# Patient Record
Sex: Female | Born: 1985 | Race: Black or African American | Hispanic: No | Marital: Single | Smoking: Current every day smoker
Health system: Southern US, Community
[De-identification: ages and names within clinical notes are randomized; demographics above are authoritative.]

## PROBLEM LIST (undated history)

## (undated) DIAGNOSIS — IMO0002 Reserved for concepts with insufficient information to code with codable children: Secondary | ICD-10-CM

## (undated) DIAGNOSIS — L309 Dermatitis, unspecified: Secondary | ICD-10-CM

## (undated) DIAGNOSIS — M329 Systemic lupus erythematosus, unspecified: Secondary | ICD-10-CM

## (undated) HISTORY — PX: HERNIA REPAIR: SHX51

---

## 2008-01-01 ENCOUNTER — Emergency Department (HOSPITAL_BASED_OUTPATIENT_CLINIC_OR_DEPARTMENT_OTHER): Admission: EM | Admit: 2008-01-01 | Discharge: 2008-01-01 | Payer: Self-pay | Admitting: Emergency Medicine

## 2010-11-10 LAB — URINALYSIS, ROUTINE W REFLEX MICROSCOPIC
Glucose, UA: NEGATIVE
Ketones, ur: NEGATIVE
Nitrite: NEGATIVE
Specific Gravity, Urine: 1.022

## 2010-11-10 LAB — PREGNANCY, URINE: Preg Test, Ur: NEGATIVE

## 2010-11-10 LAB — BASIC METABOLIC PANEL
BUN: 13
CO2: 27
Calcium: 9.9
Chloride: 103
GFR calc Af Amer: >60
Potassium: 3.8

## 2010-11-10 LAB — GC/CHLAMYDIA PROBE AMP, GENITAL
Chlamydia, DNA Probe: NEGATIVE
GC Probe Amp, Genital: NEGATIVE

## 2010-11-10 LAB — URINE MICROSCOPIC-ADD ON

## 2010-11-10 LAB — DIFFERENTIAL
Eosinophils Absolute: 0
Lymphs Abs: 1.5
Neutro Abs: 6.3

## 2010-11-10 LAB — WET PREP, GENITAL: Yeast Wet Prep HPF POC: NONE SEEN

## 2010-11-10 LAB — CBC
HCT: 38.3
MCV: 93.5
WBC: 8.3

## 2016-07-04 ENCOUNTER — Emergency Department (HOSPITAL_COMMUNITY)
Admission: EM | Admit: 2016-07-04 | Discharge: 2016-07-04 | Disposition: A | Discharge status: Home / Self Care | Payer: Medicaid Other | Attending: Emergency Medicine | Admitting: Emergency Medicine

## 2016-07-04 ENCOUNTER — Encounter (HOSPITAL_COMMUNITY): Payer: Self-pay | Admitting: *Deleted

## 2016-07-04 DIAGNOSIS — M329 Systemic lupus erythematosus, unspecified: Secondary | ICD-10-CM | POA: Insufficient documentation

## 2016-07-04 DIAGNOSIS — M79605 Pain in left leg: Secondary | ICD-10-CM | POA: Diagnosis not present

## 2016-07-04 DIAGNOSIS — M79604 Pain in right leg: Secondary | ICD-10-CM | POA: Insufficient documentation

## 2016-07-04 DIAGNOSIS — F17200 Nicotine dependence, unspecified, uncomplicated: Secondary | ICD-10-CM | POA: Insufficient documentation

## 2016-07-04 HISTORY — DX: Systemic lupus erythematosus, unspecified: M32.9

## 2016-07-04 HISTORY — DX: Reserved for concepts with insufficient information to code with codable children: IMO0002

## 2016-07-04 MED ORDER — PREDNISONE 20 MG PO TABS: ORAL_TABLET | ORAL | 0 refills | Status: DC

## 2016-07-04 MED ORDER — PREDNISONE 20 MG PO TABS
60.0000 mg | ORAL_TABLET | Freq: Once | ORAL | Status: AC
  Administered 2016-07-04: 60 mg via ORAL
  Filled 2016-07-04: qty 3

## 2016-07-04 MED ORDER — HYDROCODONE-ACETAMINOPHEN 5-325 MG PO TABS
2.0000 | ORAL_TABLET | Freq: Once | ORAL | Status: AC
Start: 2016-07-04 — End: 2016-07-04
  Administered 2016-07-04: 2 via ORAL
  Filled 2016-07-04: qty 2

## 2016-07-04 MED ORDER — HYDROMORPHONE HCL 1 MG/ML IJ SOLN
1.0000 mg | Freq: Once | INTRAMUSCULAR | Status: AC
  Administered 2016-07-04: 1 mg via INTRAVENOUS
  Filled 2016-07-04: qty 1

## 2016-07-04 MED ORDER — HYDROCODONE-ACETAMINOPHEN 5-325 MG PO TABS: 1.0000 | ORAL_TABLET | Freq: Four times a day (QID) | ORAL | 0 refills | Status: DC | PRN

## 2016-07-04 NOTE — ED Triage Notes (Signed)
Pt reports having lupus, has been out of her meds x 3 months and has increase in leg pain and swelling/numbness to her legs. No acute distress is noted at triage.

## 2016-07-04 NOTE — Discharge Instructions (Signed)
Call any of the numbers on these discharge instructions. Get a primary care physician. Take Tylenol for mild pain or the pain medicine prescribed for bad pain. Don't take Tylenol together with the pain medicine prescribed as the combination can be dangerous to your liver

## 2016-07-04 NOTE — ED Provider Notes (Signed)
[facility]-EMERGENCY DEPT Provider Note   CSN: [id number] Arrival date & time: 07/04/16  1525     History   Chief Complaint Chief Complaint  Patient presents with  . Leg Pain  . Lupus    HPI Tammy Hale is a 31 y.o. female.Complains of bilateral leg pain and swelling for the past 3 months, progressively worsenins. Symptoms typical this. She reports that she's been out of her medication for the past 3 months which include Norco and prednisone. Pain is worse with walking not improved with anything no fever no other associated symptoms.. She reports she fell yesterday, but did not sustain any injury HPI  Past Medical History:  Diagnosis Date  . Lupus     There are no active problems to display for this patient.   History reviewed. No pertinent surgical history.  OB History    No data available       Home Medications    Prior to Admission medications   Medication Sig Start Date End Date Taking? Authorizing Provider  acetaminophen (TYLENOL) 500 MG tablet Take 1,000 mg by mouth every 6 (six) hours as needed for moderate pain.   Yes [provider]    Family History History reviewed. No pertinent family history.  Social History Social History  Substance Use Topics  . Smoking status: Current Every Day Smoker  . Smokeless tobacco: Not on file  . Alcohol use No     Allergies   Penicillins   Review of Systems Review of Systems  Constitutional: Negative.   HENT: Negative.   Respiratory: Negative.   Cardiovascular: Positive for leg swelling.  Gastrointestinal: Negative.   Musculoskeletal: Positive for myalgias.       Bilateral leg pain  Skin: Negative.   Neurological: Negative.   Psychiatric/Behavioral: Negative.   All other systems reviewed and are negative.    Physical Exam Updated Vital Signs BP 106/66 (BP Location: Right Arm)   Pulse 82   Temp 98 F (36.7 C) (Oral)   Resp 20   SpO2 100%   Physical Exam  Constitutional: She appears  well-developed and well-nourished.  Tearful  HENT:  Head: Normocephalic and atraumatic.  Eyes: Conjunctivae are normal. Pupils are equal, round, and reactive to light.  Neck: Neck supple. No tracheal deviation present. No thyromegaly present.  Cardiovascular: Normal rate and regular rhythm.   No murmur heard. Pulmonary/Chest: Effort normal and breath sounds normal.  Abdominal: Soft. Bowel sounds are normal. She exhibits no distension. There is no tenderness.  Musculoskeletal: Normal range of motion. She exhibits no edema or tenderness.  Bilateral lower extremities mildly tender and swollen along shins. DP pulses 2+. Bilaterally. Skin is not red or hot  Neurological: She is alert. Coordination normal.  Skin: Skin is warm and dry. No rash noted.  Psychiatric: She has a normal mood and affect.  Nursing note and vitals reviewed.    ED Treatments / Results  Labs (all labs ordered are listed, but only abnormal results are displayed) Labs Reviewed - No data to display  EKG  EKG Interpretation None       Radiology No results found.  Procedures Procedures (including critical care time)  Medications Ordered in ED Medications  HYDROcodone-acetaminophen (NORCO/VICODIN) 5-325 MG per tablet 2 tablet (2 tablets Oral Given 07/04/16 1810)  predniSONE (DELTASONE) tablet 60 mg (60 mg Oral Given 07/04/16 1810)     Initial Impression / Assessment and Plan / ED Course  I have reviewed the triage vital signs and the nursing notes.  Pertinent labs & imaging results that were available during my care of the patient were reviewed by me and considered in my medical decision making (see chart for details).     Patient feels improved after treatment with oral and intravenous opioids and oral prednisone. She is alert and able to ambulate without difficulty. Not lightheaded on standing. Plan prescriptions prednisone, Norco . Referral primary care [facility] [place] Controlled Substance reporting  System queried Final Clinical Impressions(s) / ED Diagnoses  Diagnosis #1 bilateral leg pain #2 Lupus flare up Final diagnoses:  None    New Prescriptions New Prescriptions   No medications on file     Doug Sou, MD 07/04/16 2051

## 2016-07-20 ENCOUNTER — Encounter (HOSPITAL_COMMUNITY): Payer: Self-pay | Admitting: *Deleted

## 2016-07-20 DIAGNOSIS — G8929 Other chronic pain: Secondary | ICD-10-CM | POA: Insufficient documentation

## 2016-07-20 DIAGNOSIS — M79661 Pain in right lower leg: Secondary | ICD-10-CM | POA: Insufficient documentation

## 2016-07-20 DIAGNOSIS — M79662 Pain in left lower leg: Secondary | ICD-10-CM | POA: Insufficient documentation

## 2016-07-20 DIAGNOSIS — F17200 Nicotine dependence, unspecified, uncomplicated: Secondary | ICD-10-CM | POA: Insufficient documentation

## 2016-07-20 NOTE — ED Triage Notes (Signed)
Pt c/o lupus flare up for a few days. Reports numbness in R leg and low back pain consistent with previous flareups

## 2016-07-21 ENCOUNTER — Emergency Department (HOSPITAL_COMMUNITY)
Admission: EM | Admit: 2016-07-21 | Discharge: 2016-07-21 | Disposition: A | Discharge status: Home / Self Care | Payer: Medicaid Other | Attending: Emergency Medicine | Admitting: Emergency Medicine

## 2016-07-21 DIAGNOSIS — G8929 Other chronic pain: Secondary | ICD-10-CM

## 2016-07-21 LAB — CBC WITH DIFFERENTIAL/PLATELET
BASOS ABS: 0 10*3/uL (ref 0.0–0.1)
BASOS PCT: 0 %
EOS PCT: 2 %
Eosinophils Absolute: 0.2 10*3/uL (ref 0.0–0.7)
HCT: 36.9 % (ref 36.0–46.0)
Hemoglobin: 12.3 g/dL (ref 12.0–15.0)
Lymphocytes Relative: 44 %
Lymphs Abs: 4.3 10*3/uL — ABNORMAL HIGH (ref 0.7–4.0)
MCH: 31.5 pg (ref 26.0–34.0)
MCHC: 33.3 g/dL (ref 30.0–36.0)
MCV: 94.6 fL (ref 78.0–100.0)
MONO ABS: 0.5 10*3/uL (ref 0.1–1.0)
Monocytes Relative: 5 %
Neutro Abs: 4.8 10*3/uL (ref 1.7–7.7)
Neutrophils Relative %: 49 %
PLATELETS: 215 10*3/uL (ref 150–400)
RBC: 3.9 MIL/uL (ref 3.87–5.11)
RDW: 13.4 % (ref 11.5–15.5)
WBC: 9.8 10*3/uL (ref 4.0–10.5)

## 2016-07-21 LAB — COMPREHENSIVE METABOLIC PANEL
ALBUMIN: 3.2 g/dL — AB (ref 3.5–5.0)
ALK PHOS: 46 U/L (ref 38–126)
ALT: 10 U/L — ABNORMAL LOW (ref 14–54)
AST: 13 U/L — AB (ref 15–41)
Anion gap: 7 (ref 5–15)
BILIRUBIN TOTAL: 0.5 mg/dL (ref 0.3–1.2)
BUN: 10 mg/dL (ref 6–20)
CALCIUM: 8.4 mg/dL — AB (ref 8.9–10.3)
CO2: 22 mmol/L (ref 22–32)
Chloride: 108 mmol/L (ref 101–111)
Creatinine, Ser: 0.65 mg/dL (ref 0.44–1.00)
GFR calc Af Amer: >60 mL/min (ref >60)
GFR calc non Af Amer: >60 mL/min (ref >60)
GLUCOSE: 115 mg/dL — AB (ref 65–99)
Potassium: 3.6 mmol/L (ref 3.5–5.1)
Sodium: 137 mmol/L (ref 135–145)
TOTAL PROTEIN: 5.1 g/dL — AB (ref 6.5–8.1)

## 2016-07-21 MED ORDER — ONDANSETRON HCL 4 MG/2ML IJ SOLN
4.0000 mg | Freq: Once | INTRAMUSCULAR | Status: AC
  Administered 2016-07-21: 4 mg via INTRAVENOUS
  Filled 2016-07-21: qty 2

## 2016-07-21 MED ORDER — SODIUM CHLORIDE 0.9 % IV BOLUS (SEPSIS)
1000.0000 mL | Freq: Once | INTRAVENOUS | Status: AC
  Administered 2016-07-21: 1000 mL via INTRAVENOUS

## 2016-07-21 MED ORDER — MORPHINE SULFATE (PF) 4 MG/ML IV SOLN
4.0000 mg | Freq: Once | INTRAVENOUS | Status: AC
  Administered 2016-07-21: 4 mg via INTRAVENOUS
  Filled 2016-07-21: qty 1

## 2016-07-21 MED ORDER — PREDNISONE 10 MG (21) PO TBPK: ORAL_TABLET | ORAL | 0 refills | Status: DC

## 2016-07-21 MED ORDER — METHYLPREDNISOLONE SODIUM SUCC 125 MG IJ SOLR
125.0000 mg | Freq: Once | INTRAMUSCULAR | Status: AC
  Administered 2016-07-21: 125 mg via INTRAVENOUS
  Filled 2016-07-21: qty 2

## 2016-07-21 NOTE — Discharge Instructions (Signed)
You have been seen today for a reported Lupus flare and occurrence of your chronic pain. You were treated in accordance with current medical literature on the subject. You will need to follow up with primary care provider and/or a provider who specializes in the management of Lupus. A case management consult has been placed for you and they should be contacting you to assist you with selecting a medical provider. Return to the ED as needed.

## 2016-07-21 NOTE — ED Notes (Signed)
Pt c/o pain in bil legs, st's she is having a Lupus flare up.  Pt was seen here 2 weeks ago for same.  St's the Vicodin Rx did not help and the Prednisone broke her out.  Pt st's she normally takes Percocet and Dilaudid.  Pt st's she does not have a MD because she just moved here

## 2016-07-21 NOTE — ED Provider Notes (Signed)
[facility]PT Provider Note   CSN: [id number] Arrival date & time: 07/20/16  2337     History   Chief Complaint Chief Complaint  Patient presents with  . Lupus    HPI Jessica Checketts is a 31 y.o. female.  HPI   Kimberlin Scheel is a 32 y.o. female, with a history of Lupus, presenting to the ED with bilateral lower leg pain beginning several months ago. Also endorses lower extremity tingling. States this is typical of her lupus flares. Moved here in December 2017. Has not secured a PCP or specialist in the area. Does not remember what she was previously prescribed for Lupus maintenance. States she "usually" takes percocet and dilaudid at home for pain, but ran out in December. Diagnosed with lupus a year and a half ago.  Denies N/V, fevers, chest pain, shortness of breath, abdominal pain, back pain, falls/trauma, or any other complaints.   Past Medical History:  Diagnosis Date  . Lupus     There are no active problems to display for this patient.   History reviewed. No pertinent surgical history.  OB History    No data available       Home Medications    Prior to Admission medications   Medication Sig Start Date End Date Taking? Authorizing Provider  acetaminophen (TYLENOL) 500 MG tablet Take 1,000 mg by mouth every 6 (six) hours as needed for moderate pain.    [provider]  HYDROcodone-acetaminophen (NORCO) 5-325 MG tablet Take 1-2 tablets by mouth every 6 (six) hours as needed. 07/04/16   Doug Sou, MD  predniSONE (STERAPRED UNI-PAK 21 TAB) 10 MG (21) TBPK tablet Take 6 tabs by mouth daily  for 2 days, then 5 tabs for 2 days, then 4 tabs for 2 days, then 3 tabs for 2 days, 2 tabs for 2 days, then 1 tab by mouth daily for 2 days 07/21/16   Anselm Pancoast, PA-C    Family History No family history on file.  Social History Social History  Substance Use Topics  . Smoking status: Current Every Day Smoker  . Smokeless tobacco: Never Used  . Alcohol  use No     Allergies   Penicillins   Review of Systems Review of Systems  Constitutional: Negative for chills and fever.  Respiratory: Negative for shortness of breath.   Cardiovascular: Negative for chest pain.  Gastrointestinal: Negative for abdominal pain, diarrhea, nausea and vomiting.  Musculoskeletal: Positive for myalgias. Negative for back pain and neck pain.  Neurological: Negative for dizziness, syncope, weakness, light-headedness and headaches.       Lower extremity tingling.  All other systems reviewed and are negative.    Physical Exam Updated Vital Signs BP 121/75 (BP Location: Left Arm)   Pulse 88   Temp 98.8 F (37.1 C) (Oral)   Resp 18   SpO2 99%   Physical Exam  Constitutional: She appears well-developed and well-nourished. No distress.  HENT:  Head: Normocephalic and atraumatic.  Eyes: Conjunctivae are normal.  Neck: Neck supple.  Cardiovascular: Normal rate, regular rhythm, normal heart sounds and intact distal pulses.   Pulmonary/Chest: Effort normal and breath sounds normal. No respiratory distress.  Abdominal: Soft. There is no tenderness. There is no guarding.  Musculoskeletal: She exhibits tenderness. She exhibits no edema or deformity.  Bilateral lower extremity tenderness. Patient refuses to perform range of motion exercises to the lower extremities. No noted swelling or erythema.  Lymphadenopathy:    She has no cervical adenopathy.  Neurological: She is alert.  Sensation intact in the lower extremities. Patient would not comply with further testing.  Skin: Skin is warm and dry. She is not diaphoretic.  Psychiatric: She has a normal mood and affect. Her behavior is normal.  Nursing note and vitals reviewed.    ED Treatments / Results  Labs (all labs ordered are listed, but only abnormal results are displayed) Labs Reviewed  COMPREHENSIVE METABOLIC PANEL - Abnormal; Notable for the following:       Result Value   Glucose, Bld 115 (*)     Calcium 8.4 (*)    Total Protein 5.1 (*)    Albumin 3.2 (*)    AST 13 (*)    ALT 10 (*)    All other components within normal limits  CBC WITH DIFFERENTIAL/PLATELET - Abnormal; Notable for the following:    Lymphs Abs 4.3 (*)    All other components within normal limits    EKG  EKG Interpretation None       Radiology No results found.  Procedures Procedures (including critical care time)  Medications Ordered in ED Medications  sodium chloride 0.9 % bolus 1,000 mL (0 mLs Intravenous Stopped 07/21/16 0331)  morphine 4 MG/ML injection 4 mg (4 mg Intravenous Given 07/21/16 0218)  ondansetron (ZOFRAN) injection 4 mg (4 mg Intravenous Given 07/21/16 0217)  methylPREDNISolone sodium succinate (SOLU-MEDROL) 125 mg/2 mL injection 125 mg (125 mg Intravenous Given 07/21/16 0222)     Initial Impression / Assessment and Plan / ED Course  I have reviewed the triage vital signs and the nursing notes.  Pertinent labs & imaging results that were available during my care of the patient were reviewed by me and considered in my medical decision making (see chart for details).     Patient presents with several months of bilateral lower extremity pain. Patient attributes this to her lupus. Patient is nontoxic appearing, afebrile, not tachycardic, not tachypneic, not hypotensive, and maintains SPO2 of 99-100% on room air.  Patient became very upset when she learned she was not going to get dilaudid. She began to yell and scream. She became very animated. She ripped her IV out, jumped out of bed, and walked from the department unassisted and with a steady gait. She did not wait for discharge paperwork or instructions. Regardless, a care management consult was placed to assist patient in securing a provider who can chronically manage her Lupus and chronic pain.  I also spoke with the patient regarding strategies for managing her lupus and the importance of securing care from a medical provider who  can manage her lupus for her.  In response to this, patient made such statements as, "You are telling me I'm not being a good mother and not taking care of my children!" I attempted to clarify with the patient that I was not saying anything about her abilities as a mother, but she started to again yell at me about needing pain medication. I mentioned that if this was a lupus flare, then we needed to focus on calming the flare. Patient remained focused on pain medication.   Findings and plan of care discussed with Ross Marcusourtney Horton, MD.   Vitals:   07/21/16 0145 07/21/16 0200 07/21/16 0215 07/21/16 0300  BP: 110/66 115/75 106/65 (!) 110/59  Pulse: 68 70 68 66  Resp:    16  Temp:      TempSrc:      SpO2: 99% 100% 100% 99%      Final  Clinical Impressions(s) / ED Diagnoses   Final diagnoses:  Other chronic pain    New Prescriptions Discharge Medication List as of 07/21/2016  3:31 AM    START taking these medications   Details  predniSONE (STERAPRED UNI-PAK 21 TAB) 10 MG (21) TBPK tablet Take 6 tabs by mouth daily  for 2 days, then 5 tabs for 2 days, then 4 tabs for 2 days, then 3 tabs for 2 days, 2 tabs for 2 days, then 1 tab by mouth daily for 2 days, Print         Concepcion Living 07/21/16 0526    Shon Baton, MD 07/24/16 [phone]

## 2016-11-10 ENCOUNTER — Encounter (HOSPITAL_COMMUNITY): Payer: Self-pay | Admitting: Emergency Medicine

## 2016-11-10 ENCOUNTER — Emergency Department (HOSPITAL_COMMUNITY)
Admission: EM | Admit: 2016-11-10 | Discharge: 2016-11-10 | Disposition: A | Discharge status: Home / Self Care | Payer: Medicaid Other | Attending: Emergency Medicine | Admitting: Emergency Medicine

## 2016-11-10 DIAGNOSIS — F17210 Nicotine dependence, cigarettes, uncomplicated: Secondary | ICD-10-CM | POA: Diagnosis not present

## 2016-11-10 DIAGNOSIS — M79604 Pain in right leg: Secondary | ICD-10-CM

## 2016-11-10 DIAGNOSIS — M79661 Pain in right lower leg: Secondary | ICD-10-CM | POA: Diagnosis not present

## 2016-11-10 DIAGNOSIS — M79662 Pain in left lower leg: Secondary | ICD-10-CM | POA: Diagnosis present

## 2016-11-10 DIAGNOSIS — M79605 Pain in left leg: Secondary | ICD-10-CM

## 2016-11-10 LAB — I-STAT CHEM 8, ED
BUN: 7 mg/dL (ref 6–20)
CREATININE: 0.8 mg/dL (ref 0.44–1.00)
Calcium, Ion: 1.18 mmol/L (ref 1.15–1.40)
Chloride: 105 mmol/L (ref 101–111)
GLUCOSE: 73 mg/dL (ref 65–99)
HEMATOCRIT: 34 % — AB (ref 36.0–46.0)
HEMOGLOBIN: 11.6 g/dL — AB (ref 12.0–15.0)
Potassium: 3.9 mmol/L (ref 3.5–5.1)
Sodium: 142 mmol/L (ref 135–145)
TCO2: 25 mmol/L (ref 22–32)

## 2016-11-10 LAB — I-STAT BETA HCG BLOOD, ED (MC, WL, AP ONLY): I-stat hCG, quantitative: <5 m[IU]/mL (ref <5)

## 2016-11-10 MED ORDER — SODIUM CHLORIDE 0.9 % IV BOLUS (SEPSIS)
1000.0000 mL | Freq: Once | INTRAVENOUS | Status: AC
  Administered 2016-11-10: 1000 mL via INTRAVENOUS

## 2016-11-10 MED ORDER — HYDROCODONE-ACETAMINOPHEN 5-325 MG PO TABS: 1.0000 | ORAL_TABLET | Freq: Four times a day (QID) | ORAL | 0 refills | Status: DC | PRN

## 2016-11-10 MED ORDER — LORAZEPAM 2 MG/ML IJ SOLN
1.0000 mg | Freq: Once | INTRAMUSCULAR | Status: AC
Start: 2016-11-10 — End: 2016-11-10
  Administered 2016-11-10: 1 mg via INTRAVENOUS
  Filled 2016-11-10: qty 1

## 2016-11-10 MED ORDER — HYDROMORPHONE HCL 1 MG/ML IJ SOLN
1.0000 mg | Freq: Once | INTRAMUSCULAR | Status: AC
  Administered 2016-11-10: 1 mg via INTRAVENOUS
  Filled 2016-11-10: qty 1

## 2016-11-10 MED ORDER — HYDROCODONE-ACETAMINOPHEN 5-325 MG PO TABS
1.0000 | ORAL_TABLET | Freq: Once | ORAL | Status: AC
  Administered 2016-11-10: 1 via ORAL
  Filled 2016-11-10: qty 1

## 2016-11-10 MED ORDER — METHYLPREDNISOLONE SODIUM SUCC 125 MG IJ SOLR
125.0000 mg | Freq: Once | INTRAMUSCULAR | Status: AC
  Administered 2016-11-10: 125 mg via INTRAVENOUS
  Filled 2016-11-10: qty 2

## 2016-11-10 MED ORDER — KETOROLAC TROMETHAMINE 30 MG/ML IJ SOLN
15.0000 mg | Freq: Once | INTRAMUSCULAR | Status: AC
  Administered 2016-11-10: 15 mg via INTRAMUSCULAR
  Filled 2016-11-10: qty 1

## 2016-11-10 MED ORDER — PREDNISONE 20 MG PO TABS: 40.0000 mg | ORAL_TABLET | Freq: Every day | ORAL | 0 refills | Status: DC

## 2016-11-10 NOTE — Discharge Instructions (Signed)
As discussed, with your lupus it is very important that you have your condition monitored and managed by a primary care physician.  Please use the provided resources to obtain a primary care physician.  Return here for concerning changes in your condition.

## 2016-11-10 NOTE — ED Provider Notes (Signed)
[facility]-EMERGENCY DEPT Provider Note   CSN: [id number] Arrival date & time: 11/10/16  1046     History   Chief Complaint Chief Complaint  Patient presents with  . Lupus  . Numbness    HPI Tammy Hale is a 31 y.o. female.  HPI  Patient presents with concern bilateral lower extremity pain, right arm numbness and tingling. Patient states that she has multiple medical issues including lupus. She takes no medication regularly for her disease. Over the past 3 or 4 days she has had increasing symptoms, without relief from anything. Symptoms are worse with activity. No new fever, vomiting, diarrhea, incontinence. The vision changes, confusion, disorientation. Patient recently relocated to this area, has no primary care physician.   Past Medical History:  Diagnosis Date  . Lupus     There are no active problems to display for this patient.   History reviewed. No pertinent surgical history.  OB History    No data available       Home Medications    Prior to Admission medications   Medication Sig Start Date End Date Taking? Authorizing Provider  etonogestrel (NEXPLANON) 68 MG IMPL implant 1 each by Subdermal route once.   Yes [provider]  HYDROcodone-acetaminophen (NORCO) 5-325 MG tablet Take 1-2 tablets by mouth every 6 (six) hours as needed. Patient not taking: Reported on 11/10/2016 07/04/16   Doug Sou, MD  predniSONE (STERAPRED UNI-PAK 21 TAB) 10 MG (21) TBPK tablet Take 6 tabs by mouth daily  for 2 days, then 5 tabs for 2 days, then 4 tabs for 2 days, then 3 tabs for 2 days, 2 tabs for 2 days, then 1 tab by mouth daily for 2 days Patient not taking: Reported on 11/10/2016 07/21/16   Anselm Pancoast, PA-C    Family History History reviewed. No pertinent family history.  Social History Social History  Substance Use Topics  . Smoking status: Current Every Day Smoker  . Smokeless tobacco: Never Used  . Alcohol use No     Allergies    Penicillins   Review of Systems Review of Systems  Constitutional:       Per HPI, otherwise negative  HENT:       Per HPI, otherwise negative  Respiratory:       Per HPI, otherwise negative  Cardiovascular:       Per HPI, otherwise negative  Gastrointestinal: Negative for vomiting.  Endocrine:       Negative aside from HPI  Genitourinary:       Neg aside from HPI   Musculoskeletal:       Per HPI, otherwise negative  Skin: Negative.   Allergic/Immunologic: Positive for immunocompromised state.  Neurological: Positive for weakness and numbness. Negative for syncope.     Physical Exam Updated Vital Signs BP 132/86 (BP Location: Right Arm)   Pulse 82   Temp 97.9 F (36.6 C) (Oral)   Resp 16   Ht  (1.676 m)   Wt 59.9 kg (132 lb)   SpO2 100%   BMI 21.31 kg/m   Physical Exam  Constitutional: She is oriented to person, place, and time. She appears well-developed and well-nourished. No distress.  Uncomfortable appearing female resting in left lateral decubitus position.  HENT:  Head: Normocephalic and atraumatic.  Eyes: Conjunctivae and EOM are normal.  Cardiovascular: Normal rate and regular rhythm.   Pulmonary/Chest: Effort normal and breath sounds normal. No stridor. No respiratory distress.  Abdominal: She exhibits no distension.  Musculoskeletal:  She exhibits no edema.  Patient unwilling to move the legs secondary to pain. Patient states that she cannot move her right arm, but prior to physical exam she was moving spontaneously.   Neurological: She is alert and oriented to person, place, and time. She displays atrophy. No cranial nerve deficit.  Skin: Skin is warm and dry.  Psychiatric: Her mood appears anxious.  Nursing note and vitals reviewed.    ED Treatments / Results   Procedures Procedures (including critical care time)  Medications Ordered in ED Medications  methylPREDNISolone sodium succinate (SOLU-MEDROL) 125 mg/2 mL injection 125 mg (125  mg Intravenous Given 11/10/16 1259)  HYDROmorphone (DILAUDID) injection 1 mg (1 mg Intravenous Given 11/10/16 1259)  ketorolac (TORADOL) 30 MG/ML injection 15 mg (15 mg Intramuscular Given 11/10/16 1300)  sodium chloride 0.9 % bolus 1,000 mL (0 mLs Intravenous Stopped 11/10/16 1438)  LORazepam (ATIVAN) injection 1 mg (1 mg Intravenous Given 11/10/16 1413)  HYDROmorphone (DILAUDID) injection 1 mg (1 mg Intravenous Given 11/10/16 1436)  sodium chloride 0.9 % bolus 1,000 mL (1,000 mLs Intravenous New Bag/Given 11/10/16 1437)     Initial Impression / Assessment and Plan / ED Course  I have reviewed the triage vital signs and the nursing notes.  Pertinent labs & imaging results that were available during my care of the patient were reviewed by me and considered in my medical decision making (see chart for details).  Chart review notable for 2 prior ED visits in the past 6 months, for leg pain, lupus flare.  2:35 PM After initial IVF, analgesia, steroids, the patient now notes that she can feel her legs.  3:29 PM Patient much more calm. Her IV has become dislodged, and she has not completed her IV fluids. We discussed the importance of following up with primary care physician to ensure appropriate management of her lupus. She is moving her legs spontaneously, pain appears improved.  Patient will receive additional IV fluids, analgesia, and she continues to improve, patient will be discharged to follow-up with primary care Resources provided. Final Clinical Impressions(s) / ED Diagnoses  Lower extremity pain   Gerhard Munch, MD 11/10/16 1531

## 2016-11-10 NOTE — ED Triage Notes (Signed)
Pt with hx of lupus. Pt states she woke last night with numbness and tingling in bilateral lower extremities and R upper extremity. Pt states burning sensation at times in legs and similar to last lupus flare-up.  Pt does not take any meds regularly for lupus.

## 2016-11-10 NOTE — ED Notes (Signed)
Pt crying stating the pain is worse since meds given, Dr Jeraldine Loots aware

## 2016-11-12 ENCOUNTER — Emergency Department (HOSPITAL_COMMUNITY)
Admission: EM | Admit: 2016-11-12 | Discharge: 2016-11-12 | Disposition: A | Discharge status: Home / Self Care | Payer: Medicaid Other | Attending: Emergency Medicine | Admitting: Emergency Medicine

## 2016-11-12 ENCOUNTER — Encounter (HOSPITAL_COMMUNITY): Payer: Self-pay | Admitting: *Deleted

## 2016-11-12 DIAGNOSIS — M79605 Pain in left leg: Secondary | ICD-10-CM | POA: Diagnosis not present

## 2016-11-12 DIAGNOSIS — M79604 Pain in right leg: Secondary | ICD-10-CM | POA: Insufficient documentation

## 2016-11-12 DIAGNOSIS — F17210 Nicotine dependence, cigarettes, uncomplicated: Secondary | ICD-10-CM | POA: Diagnosis not present

## 2016-11-12 DIAGNOSIS — R52 Pain, unspecified: Secondary | ICD-10-CM | POA: Diagnosis present

## 2016-11-12 LAB — URINALYSIS, ROUTINE W REFLEX MICROSCOPIC
BILIRUBIN URINE: NEGATIVE
Glucose, UA: NEGATIVE mg/dL
Hgb urine dipstick: NEGATIVE
KETONES UR: NEGATIVE mg/dL
Nitrite: NEGATIVE
PH: 7 (ref 5.0–8.0)
PROTEIN: NEGATIVE mg/dL
Specific Gravity, Urine: 1.014 (ref 1.005–1.030)

## 2016-11-12 LAB — BASIC METABOLIC PANEL
Anion gap: 7 (ref 5–15)
BUN: 11 mg/dL (ref 6–20)
CALCIUM: 9 mg/dL (ref 8.9–10.3)
CHLORIDE: 107 mmol/L (ref 101–111)
CO2: 25 mmol/L (ref 22–32)
CREATININE: 0.64 mg/dL (ref 0.44–1.00)
GFR calc non Af Amer: >60 mL/min (ref >60)
GLUCOSE: 77 mg/dL (ref 65–99)
Potassium: 3.5 mmol/L (ref 3.5–5.1)
Sodium: 139 mmol/L (ref 135–145)

## 2016-11-12 LAB — CBC
HCT: 34.8 % — ABNORMAL LOW (ref 36.0–46.0)
Hemoglobin: 12 g/dL (ref 12.0–15.0)
MCH: 32.5 pg (ref 26.0–34.0)
MCHC: 34.5 g/dL (ref 30.0–36.0)
MCV: 94.3 fL (ref 78.0–100.0)
PLATELETS: 246 10*3/uL (ref 150–400)
RBC: 3.69 MIL/uL — ABNORMAL LOW (ref 3.87–5.11)
RDW: 13.6 % (ref 11.5–15.5)
WBC: 8.4 10*3/uL (ref 4.0–10.5)

## 2016-11-12 MED ORDER — PREDNISONE 20 MG PO TABS: 40.0000 mg | ORAL_TABLET | Freq: Every day | ORAL | 0 refills | Status: AC

## 2016-11-12 MED ORDER — HYDROMORPHONE HCL 1 MG/ML IJ SOLN
1.0000 mg | Freq: Once | INTRAMUSCULAR | Status: AC
  Administered 2016-11-12: 1 mg via INTRAVENOUS
  Filled 2016-11-12: qty 1

## 2016-11-12 MED ORDER — DIPHENHYDRAMINE HCL 25 MG PO CAPS
25.0000 mg | ORAL_CAPSULE | Freq: Once | ORAL | Status: AC
  Administered 2016-11-12: 25 mg via ORAL
  Filled 2016-11-12: qty 1

## 2016-11-12 MED ORDER — SODIUM CHLORIDE 0.9 % IV BOLUS (SEPSIS)
500.0000 mL | Freq: Once | INTRAVENOUS | Status: AC
  Administered 2016-11-12: 500 mL via INTRAVENOUS

## 2016-11-12 MED ORDER — OXYCODONE-ACETAMINOPHEN 5-325 MG PO TABS
1.0000 | ORAL_TABLET | ORAL | Status: DC | PRN
  Administered 2016-11-12: 1 via ORAL
  Filled 2016-11-12: qty 1

## 2016-11-12 MED ORDER — HYDROMORPHONE HCL 1 MG/ML IJ SOLN
2.0000 mg | Freq: Once | INTRAMUSCULAR | Status: AC
  Administered 2016-11-12: 2 mg via INTRAVENOUS
  Filled 2016-11-12: qty 2

## 2016-11-12 MED ORDER — METHYLPREDNISOLONE SODIUM SUCC 125 MG IJ SOLR
125.0000 mg | Freq: Once | INTRAMUSCULAR | Status: AC
  Administered 2016-11-12: 125 mg via INTRAVENOUS
  Filled 2016-11-12: qty 2

## 2016-11-12 NOTE — ED Notes (Signed)
Pt ambulated to bathroom and back to room with minimal assistance

## 2016-11-12 NOTE — ED Provider Notes (Signed)
Patient complains of bilateral leg pain and having trouble moving her leg and her right hand for the past 3 days. She states this is her typical lupus flareup. She denies any fever denies injury she did fall out of bed this morning trying to get up. Patient suffers from chronic pain.   Doug Sou, MD 11/12/16 [phone]

## 2016-11-12 NOTE — ED Notes (Signed)
Patient c/o pain is not getting any better, "feels like you didn't give me anything"

## 2016-11-12 NOTE — ED Notes (Signed)
Bed: WA02 Expected date:  Expected time:  Means of arrival:  Comments: 

## 2016-11-12 NOTE — ED Triage Notes (Signed)
Pt w/ hx of lupus complains of body aches all over her body x 3 days. Pt states she came to ED 2 days ago and was given steroids but states her symptoms are not improving. Pt states her symptoms are now worse. Pt also complains of migraine.

## 2016-11-12 NOTE — Discharge Instructions (Signed)
It is very important that you establish primary care for management of your lupus. Take ibuprofen 400 mg (2 pills) three times a day with meals to help with swelling. Do not take other antiinflammatories at the same time (advil, motrin, naproxen, aleve).  Take vicodin as needed for breakthrough pain.  Take steroids for the next 4 days.  Return to the ER if you develop fevers, chills, or any new or worsening symptoms.

## 2016-11-12 NOTE — ED Provider Notes (Signed)
[facility]-EMERGENCY DEPT Provider Note   CSN: [id number] Arrival date & time: 11/12/16  1157     History   Chief Complaint Chief Complaint  Patient presents with  . Generalized Body Aches  . Migraine    HPI Tammy Hale is a 31 y.o. female presenting with bilateral leg, back, and right hand pain.  Patient states that pain started 3 days ago. This feels like her normal lupus flare. She's been seen multiple times in the past for lupus flares. She was seen on 10/03 for the same, given IV analgesics and steroids with reported improvement, and discharged on prednisone and Vicodin. She reports that she's had no improvement of her pain with this treatment. She states that this morning she was in so much pain that she was unable to move her legs. She assisted her kids with getting to school, but after getting back in bed, she was unable to stand up and walk. She reports that she rolled out of her bed and crawled to the front door to get help. She denies hitting her head or loss of consciousness. She denies injury from rolling out of her bed. Currently she states that she is unable to move her legs at all. She reports she cannot move the fingers of her right hand. She has been taking her prednisone, 40 mg, every day as prescribed. She took 2 Vicodin prior to arrival. She states that she is not on any medicines for lupus currently. She moved to [place] in December, has not yet established primary care. She states that she used to be on gabapentin, but is not taking that currently. Per chart review, patient has stated that she was on Dilaudid, Norco, and steroids at various times for her lupus. She states she has been hospitalized for her lupus flares in the past.   HPI  Past Medical History:  Diagnosis Date  . Lupus     There are no active problems to display for this patient.   History reviewed. No pertinent surgical history.  OB History    No data available       Home Medications      Prior to Admission medications   Medication Sig Start Date End Date Taking? Authorizing Provider  HYDROcodone-acetaminophen (NORCO) 5-325 MG tablet Take 1-2 tablets by mouth every 6 (six) hours as needed. Patient taking differently: Take 1-2 tablets by mouth every 6 (six) hours as needed for moderate pain or severe pain.  11/10/16  Yes Gerhard Munch, MD  etonogestrel (NEXPLANON) 68 MG IMPL implant 1 each by Subdermal route once.    [provider]  predniSONE (DELTASONE) 20 MG tablet Take 2 tablets (40 mg total) by mouth daily with breakfast. For the next four days 11/12/16 11/14/16  Arnav Cregg, PA-C    Family History No family history on file.  Social History Social History  Substance Use Topics  . Smoking status: Current Every Day Smoker  . Smokeless tobacco: Never Used  . Alcohol use No     Allergies   Penicillins   Review of Systems Review of Systems  Constitutional: Negative for chills and fever.  HENT: Negative for congestion and sore throat.   Respiratory: Negative for cough, chest tightness and shortness of breath.   Cardiovascular: Negative for chest pain.  Gastrointestinal: Negative for abdominal pain, constipation, diarrhea, nausea and vomiting.  Genitourinary: Negative for dysuria, frequency and hematuria.  Musculoskeletal: Positive for arthralgias, back pain and myalgias.  Skin: Negative for wound.  Allergic/Immunologic: Negative  for immunocompromised state.  Neurological: Positive for headaches. Negative for dizziness and light-headedness.  Hematological: Does not bruise/bleed easily.  Psychiatric/Behavioral: Negative for confusion.     Physical Exam Updated Vital Signs BP 116/70 (BP Location: Right Arm)   Pulse 61   Temp 98 F (36.7 C) (Oral)   Resp 14   SpO2 100%   Physical Exam  Constitutional: She is oriented to person, place, and time. She appears well-developed and well-nourished. No distress.  HENT:  Head: Normocephalic and  atraumatic.  Mouth/Throat: Uvula is midline, oropharynx is clear and moist and mucous membranes are normal.  Eyes: Pupils are equal, round, and reactive to light. Conjunctivae and EOM are normal.  Neck: Normal range of motion.  Cardiovascular: Normal rate, regular rhythm and intact distal pulses.   Pulmonary/Chest: Effort normal and breath sounds normal. No respiratory distress. She has no decreased breath sounds. She has no wheezes. She has no rhonchi. She has no rales.  Abdominal: Soft. Bowel sounds are normal. She exhibits no distension. There is no tenderness. There is no rebound and no guarding.  Musculoskeletal:  Patient states she is unable to move the third fourth and fifth fingers of her right hand. Decreased grip strength on the side due to this. Patient states she is unable to move her feet, knees, or legs. She was able to go from lying to sitting without difficulty. Tenderness to palpation of the entire back. Radial pedal pulses intact bilaterally. No pain or swelling of the calves. Patient states she is unable to feel her legs bilaterally, but was aware that I was removing her socks. No obvious injury, laceration, or contusion. Radial and pedal pulses intact bilaterally.  Lymphadenopathy:    She has no cervical adenopathy.  Neurological: She is alert and oriented to person, place, and time. A sensory deficit (per patient) is present. GCS eye subscore is 4. GCS verbal subscore is 5. GCS motor subscore is 6.  Skin: Skin is warm and dry. No rash noted.  Psychiatric: She has a normal mood and affect.  Nursing note and vitals reviewed.    ED Treatments / Results  Labs (all labs ordered are listed, but only abnormal results are displayed) Labs Reviewed  URINALYSIS, ROUTINE W REFLEX MICROSCOPIC - Abnormal; Notable for the following:       Result Value   APPearance HAZY (*)    Leukocytes, UA LARGE (*)    Bacteria, UA RARE (*)    Squamous Epithelial / LPF 0-5 (*)    All other  components within normal limits  CBC - Abnormal; Notable for the following:    RBC 3.69 (*)    HCT 34.8 (*)    All other components within normal limits  BASIC METABOLIC PANEL    EKG  EKG Interpretation None       Radiology No results found.  Procedures Procedures (including critical care time)  Medications Ordered in ED Medications  methylPREDNISolone sodium succinate (SOLU-MEDROL) 125 mg/2 mL injection 125 mg (125 mg Intravenous Given 11/12/16 1653)  HYDROmorphone (DILAUDID) injection 1 mg (1 mg Intravenous Given 11/12/16 1653)  sodium chloride 0.9 % bolus 500 mL (0 mLs Intravenous Stopped 11/12/16 1735)  HYDROmorphone (DILAUDID) injection 2 mg (2 mg Intravenous Given 11/12/16 1759)  diphenhydrAMINE (BENADRYL) capsule 25 mg (25 mg Oral Given 11/12/16 1910)     Initial Impression / Assessment and Plan / ED Course  I have reviewed the triage vital signs and the nursing notes.  Pertinent labs & imaging results that  were available during my care of the patient were reviewed by me and considered in my medical decision making (see chart for details).  Clinical Course as of Nov 13 45  Fri Nov 12, 2016  1625 Patient presenting with 3 days of bilateral leg pain, back pain, right hand pain. This is her typical lupus flare symptoms. Pain is not improving with prednisone and Vicodin. Will order basic labs, UA, and try to control pain. Discussed case with attending, Dr. Ethelda Chick evaluated the patient. Per his instructions, I will give dose of solumedrol and dilaudid.   [[place]]  1728 On reassessment, patient reports her pain is not improved at all. She still crying in the room. She reports that she has increased movement. She is holding her phone with her right hand without any difficulty. She states she can wiggle her toes, was able to cross on cross her legs. She reports she is unable to walk still. Discussed with attending, and will give another dose of dilaudid.   [[place]]  1825 On  reassessment, patient reports pain is improved. She is moving her right hand without any difficulty, and reports into good improvement of movement of her legs. Will attempt to ambulate.  [[place]]  1850 Patient was able to ambulate without difficulty. Discussed findings with patient. Discussed importance of establishing primary care. Will give 2 more days of prednisone, and patient to take them after she is finished or other prednisone. Pt to take scheduled NSAIDs with her previously prescribed Vicodin as needed for breakthrough. At this time, patient appears safe for discharge. Return precautions given. Patient states she understands and agrees to plan.  [[place]]    Clinical Course User Index [[place]] Alveria Apley, PA-C    Final Clinical Impressions(s) / ED Diagnoses   Final diagnoses:  Bilateral leg pain    New Prescriptions Discharge Medication List as of 11/12/2016  7:06 PM       Alveria Apley, PA-C 11/13/16 0047    Doug Sou, MD 11/19/16 [phone]

## 2017-01-13 DIAGNOSIS — G8929 Other chronic pain: Secondary | ICD-10-CM | POA: Insufficient documentation

## 2017-07-13 ENCOUNTER — Encounter: Payer: Self-pay | Admitting: *Deleted

## 2017-07-27 ENCOUNTER — Ambulatory Visit: Payer: Medicaid Other | Admitting: Obstetrics & Gynecology

## 2017-08-24 ENCOUNTER — Other Ambulatory Visit (HOSPITAL_COMMUNITY)
Admission: RE | Admit: 2017-08-24 | Discharge: 2017-08-24 | Disposition: A | Discharge status: Home / Self Care | Payer: Medicaid Other | Source: Ambulatory Visit | Attending: Obstetrics & Gynecology | Admitting: Obstetrics & Gynecology

## 2017-08-24 ENCOUNTER — Ambulatory Visit (INDEPENDENT_AMBULATORY_CARE_PROVIDER_SITE_OTHER): Payer: Medicaid Other | Admitting: Obstetrics and Gynecology

## 2017-08-24 ENCOUNTER — Encounter: Payer: Self-pay | Admitting: Obstetrics and Gynecology

## 2017-08-24 DIAGNOSIS — N871 Moderate cervical dysplasia: Secondary | ICD-10-CM | POA: Insufficient documentation

## 2017-08-24 DIAGNOSIS — R87613 High grade squamous intraepithelial lesion on cytologic smear of cervix (HGSIL): Secondary | ICD-10-CM | POA: Diagnosis not present

## 2017-08-24 LAB — POCT PREGNANCY, URINE: Preg Test, Ur: NEGATIVE

## 2017-08-24 NOTE — Progress Notes (Signed)
PT STATES LOWER ABDOMINAL PAIN

## 2017-08-24 NOTE — Progress Notes (Signed)
Patient ID: Norva KarvonenKiara Labelle, female   DOB: April 23, 1985, 32 y.o.   MRN: [id number]   GYNECOLOGY CLINIC PROCEDURE NOTE  Norva KarvonenKiara Lutzke is a 32 y.o. No obstetric history on file. here for LEEP. No GYN concerns. Pap smear and colposcopy reviewed.  UPT negative. Depo Provera for contraception  Pap 4/19 LGSIL Colpo Biopsy CIN 1-2 4/19   Risks, benefits, alternatives, and limitations of procedure explained to patient, including pain, bleeding, infection, failure to remove abnormal tissue and failure to cure dysplasia, need for repeat procedures, damage to pelvic organs, cervical incompetence.  Role of HPV,cervical dysplasia and need for close followup was empasized. Informed written consent was obtained. All questions were answered. Time out performed.  Procedure: The patient was placed in lithotomy position and the bivalved coated speculum was placed in the patient's vagina. A grounding pad placed on the patient. Lugol's solution was applied to the cervix and areas of decreased uptake were noted around the transformation zone.   Local anesthesia was administered via an intracervical block using 10cc of 2% Lidocaine with epinephrine. The suction was turned on and the Medium 1X Fisher Cone Biopsy Excisor on 50 Watts of cutting current was used to excise the area of decreased uptake and excise the entire transformation zone. Excellent hemostasis was achieved using roller ball coagulation set at 50 Watts coagulation current. Monsel's solution was then applied and the speculum was removed from the vagina. Specimens were sent to pathology.  The patient tolerated the procedure well. Post-operative instructions given to patient, including instruction to seek medical attention for persistent bright red bleeding, fever, abdominal/pelvic pain, dysuria, nausea or vomiting. She was also told about the possibility of having copious yellow to black tinged discharge for weeks. She was counseled to avoid anything in the vagina  (sex/douching/tampons) for 4 weeks. She has a 4 week post-operative check to assess wound healing, review results and discuss further management.   Nettie ElmMichael Fauna Neuner, MD, FACOG Attending Obstetrician & Gynecologist Center [facility], [facility]

## 2017-08-24 NOTE — Patient Instructions (Signed)

## 2017-08-25 DIAGNOSIS — R87613 High grade squamous intraepithelial lesion on cytologic smear of cervix (HGSIL): Secondary | ICD-10-CM | POA: Diagnosis not present

## 2017-09-23 ENCOUNTER — Ambulatory Visit: Payer: Medicaid Other | Admitting: Obstetrics and Gynecology

## 2017-10-17 ENCOUNTER — Ambulatory Visit: Payer: Medicaid Other | Admitting: Obstetrics and Gynecology

## 2018-04-16 ENCOUNTER — Emergency Department (HOSPITAL_COMMUNITY)
Admission: EM | Admit: 2018-04-16 | Discharge: 2018-04-16 | Disposition: A | Discharge status: Home / Self Care | Payer: Medicaid Other | Attending: Emergency Medicine | Admitting: Emergency Medicine

## 2018-04-16 ENCOUNTER — Encounter (HOSPITAL_COMMUNITY): Payer: Self-pay | Admitting: Emergency Medicine

## 2018-04-16 ENCOUNTER — Other Ambulatory Visit: Payer: Self-pay

## 2018-04-16 DIAGNOSIS — F17200 Nicotine dependence, unspecified, uncomplicated: Secondary | ICD-10-CM | POA: Insufficient documentation

## 2018-04-16 DIAGNOSIS — R05 Cough: Secondary | ICD-10-CM | POA: Diagnosis present

## 2018-04-16 DIAGNOSIS — J069 Acute upper respiratory infection, unspecified: Secondary | ICD-10-CM | POA: Diagnosis not present

## 2018-04-16 DIAGNOSIS — B9789 Other viral agents as the cause of diseases classified elsewhere: Secondary | ICD-10-CM

## 2018-04-16 LAB — GROUP A STREP BY PCR: Group A Strep by PCR: NOT DETECTED

## 2018-04-16 MED ORDER — IBUPROFEN 600 MG PO TABS: 600.0000 mg | ORAL_TABLET | Freq: Three times a day (TID) | ORAL | 0 refills | Status: AC | PRN

## 2018-04-16 MED ORDER — GUAIFENESIN ER 1200 MG PO TB12: 1.0000 | ORAL_TABLET | Freq: Two times a day (BID) | ORAL | 0 refills | Status: DC

## 2018-04-16 MED ORDER — BENZONATATE 100 MG PO CAPS: 100.0000 mg | ORAL_CAPSULE | Freq: Three times a day (TID) | ORAL | 0 refills | Status: DC

## 2018-04-16 NOTE — ED Triage Notes (Signed)
Pt reports productive cough x 4-5 days with stuffy nose x 6 days. 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 Pt reports aching pain of 5-6 in both legs.

## 2018-04-16 NOTE — ED Provider Notes (Signed)
[facility] EMERGENCY DEPARTMENT Provider Note   CSN: [id number] Arrival date & time: 04/16/18  1619    History   Chief Complaint Chief Complaint  Patient presents with  . Cough    HPI Tammy Hale is a 33 y.o. female.   HPI Patient presents to the emergency room for evaluation of cough and congestion.  Patient states she started having her symptoms today.  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  Patient has a sore scratchy throat.  She has been coughing.  Occasionally she brings up mucus.  She denies any shortness of breath.  No fevers.  She denies any myalgias.  No vomiting or diarrhea. Past Medical History:  Diagnosis Date  . Lupus [facility])     Patient Active Problem List   Diagnosis Date Noted  . Moderate cervical atypia 08/24/2017    Past Surgical History:  Procedure Laterality Date  . HERNIA REPAIR       OB History   No obstetric history on file.      Home Medications    Prior to Admission medications   Medication Sig Start Date End Date Taking? Authorizing Provider  benzonatate (TESSALON) 100 MG capsule Take 1 capsule (100 mg total) by mouth every 8 (eight) hours. 04/16/18   Linwood Dibbles, MD  Guaifenesin 1200 MG TB12 Take 1 tablet (1,200 mg total) by mouth 2 (two) times daily at 10 AM and 5 PM. 04/16/18   Linwood Dibbles, MD  HYDROcodone-acetaminophen (NORCO) 5-325 MG tablet Take 1-2 tablets by mouth every 6 (six) hours as needed. Patient taking differently: Take 1-2 tablets by mouth every 6 (six) hours as needed for moderate pain or severe pain.  11/10/16   Gerhard Munch, MD  ibuprofen (ADVIL,MOTRIN) 600 MG tablet Take 1 tablet (600 mg total) by mouth every 8 (eight) hours as needed for up to 7 days. 04/16/18 04/23/18  Linwood Dibbles, MD    Family History History reviewed. No pertinent family history.  Social History Social History   Tobacco Use  . Smoking status: Current Every Day Smoker    Packs/day: 0.50    . Smokeless tobacco: Never Used  Substance Use Topics  . Alcohol use: No  . Drug use: Yes    Types: Marijuana     Allergies   Penicillins   Review of Systems Review of Systems  All other systems reviewed and are negative.    Physical Exam Updated Vital Signs BP 132/84   Pulse (!) 115   Temp 99.6 F (37.6 C) (Oral)   Resp 18   Ht 1.676 m (5\' 6" )   Wt 59.9 kg   LMP 03/24/2018 (Approximate)   SpO2 98%   BMI 21.31 kg/m   Physical Exam Vitals signs and nursing note reviewed.  Constitutional:      General: She is not in acute distress.    Appearance: She is well-developed.  HENT:     Head: Normocephalic and atraumatic.     Right Ear: External ear normal.     Left Ear: External ear normal.  Eyes:     General: No scleral icterus.       Right eye: No discharge.        Left eye: No discharge.     Conjunctiva/sclera: Conjunctivae normal.  Neck:     Musculoskeletal: Neck supple.     Trachea: No tracheal deviation.  Cardiovascular:     Rate and Rhythm: Normal rate and  regular rhythm.  Pulmonary:     Effort: Pulmonary effort is normal. No respiratory distress.     Breath sounds: Normal breath sounds. No stridor. No wheezing or rales.  Abdominal:     General: Bowel sounds are normal. There is no distension.     Palpations: Abdomen is soft.     Tenderness: There is no abdominal tenderness. There is no guarding or rebound.  Musculoskeletal:        General: No tenderness.  Skin:    General: Skin is warm and dry.     Findings: No rash.  Neurological:     Mental Status: She is alert.     Cranial Nerves: No cranial nerve deficit (no facial droop, extraocular movements intact, no slurred speech).     Sensory: No sensory deficit.     Motor: No abnormal muscle tone or seizure activity.     Coordination: Coordination normal.      ED Treatments / Results  Labs (all labs ordered are listed, but only abnormal results are displayed) Labs Reviewed  GROUP A STREP BY  PCR    EKG None  Radiology No results found.  Procedures Procedures (including critical care time)  Medications Ordered in ED Medications - No data to display   Initial Impression / Assessment and Plan / ED Course  I have reviewed the triage vital signs and the nursing notes.  Pertinent labs & imaging results that were available during my care of the patient were reviewed by me and considered in my medical decision making (see chart for details).   Patient presented to the emergency room for evaluation of cough and congestion.  Patient also had a sore throat.  Strep test was negative.  Are consistent with a viral infection.  Possible mild influenza-like illness.  Patient is nontoxic and well-appearing.  Stable for discharge.  Discussed supportive measures, warning signs and precautions.  Final Clinical Impressions(s) / ED Diagnoses   Final diagnoses:  Viral URI with cough    ED Discharge Orders         Ordered    benzonatate (TESSALON) 100 MG capsule  Every 8 hours     04/16/18 1801    Guaifenesin 1200 MG TB12  2 times daily     04/16/18 1801    ibuprofen (ADVIL,MOTRIN) 600 MG tablet  Every 8 hours PRN     04/16/18 1801           Linwood Dibbles, MD 04/16/18 Flossie Buffy

## 2018-04-16 NOTE — Discharge Instructions (Addendum)
The medications as prescribed, follow-up with your primary care doctor if not improving in the next week

## 2018-04-20 ENCOUNTER — Emergency Department (HOSPITAL_COMMUNITY)
Admission: EM | Admit: 2018-04-20 | Discharge: 2018-04-20 | Disposition: A | Discharge status: Home / Self Care | Payer: Medicaid Other

## 2018-06-04 ENCOUNTER — Encounter (HOSPITAL_COMMUNITY): Payer: Self-pay | Admitting: Emergency Medicine

## 2018-06-04 ENCOUNTER — Ambulatory Visit (HOSPITAL_COMMUNITY)
Admission: EM | Admit: 2018-06-04 | Discharge: 2018-06-04 | Disposition: A | Discharge status: Home / Self Care | Payer: Medicaid Other | Attending: Family Medicine | Admitting: Family Medicine

## 2018-06-04 DIAGNOSIS — S025XXB Fracture of tooth (traumatic), initial encounter for open fracture: Secondary | ICD-10-CM | POA: Diagnosis not present

## 2018-06-04 DIAGNOSIS — K047 Periapical abscess without sinus: Secondary | ICD-10-CM

## 2018-06-04 MED ORDER — HYDROCODONE-ACETAMINOPHEN 5-325 MG PO TABS: 1.0000 | ORAL_TABLET | Freq: Four times a day (QID) | ORAL | 0 refills | Status: DC | PRN

## 2018-06-04 MED ORDER — CLINDAMYCIN HCL 300 MG PO CAPS: 300.0000 mg | ORAL_CAPSULE | Freq: Three times a day (TID) | ORAL | 0 refills | Status: DC

## 2018-06-04 NOTE — ED Provider Notes (Signed)
[facility]    CSN: [id number] Arrival date & time: 06/04/18  1227     History   Chief Complaint Chief Complaint  Patient presents with  . Dental Pain    HPI Tammy Hale is a 33 y.o. female.   HPI Patient has a fractured tooth.  She has been unable to seek dental care.  She states that over the last week it became very swollen and painful.  Last night some pus drained from the area.  The tooth is broken off at the gumline with tooth fragments visible at the surface.  She states she has been taking ibuprofen and Tylenol with no pain relief.  No fever.  She is eating soft foods. Past Medical History:  Diagnosis Date  . Lupus [facility](HCC)     Patient Active Problem List   Diagnosis Date Noted  . Moderate cervical atypia 08/24/2017    Past Surgical History:  Procedure Laterality Date  . HERNIA REPAIR      OB History   No obstetric history on file.      Home Medications    Prior to Admission medications   Medication Sig Start Date End Date Taking? Authorizing Provider  clindamycin (CLEOCIN) 300 MG capsule Take 1 capsule (300 mg total) by mouth 3 (three) times daily. 06/04/18   Eustace MooreNelson, Ramzi Brathwaite Sue, MD  HYDROcodone-acetaminophen (NORCO/VICODIN) 5-325 MG tablet Take 1-2 tablets by mouth every 6 (six) hours as needed for severe pain. 06/04/18   Eustace MooreNelson, Elza Varricchio Sue, MD    Family History No family history on file.  Social History Social History   Tobacco Use  . Smoking status: Current Every Day Smoker    Packs/day: 0.50  . Smokeless tobacco: Never Used  Substance Use Topics  . Alcohol use: No  . Drug use: Yes    Types: Marijuana     Allergies   Penicillins   Review of Systems Review of Systems  Constitutional: Negative for chills and fever.  HENT: Positive for dental problem. Negative for ear pain and sore throat.   Eyes: Negative for pain and visual disturbance.  Respiratory: Negative for cough and shortness of breath.   Cardiovascular: Negative  for chest pain and palpitations.  Gastrointestinal: Negative for abdominal pain and vomiting.  Genitourinary: Negative for dysuria and hematuria.  Musculoskeletal: Negative for arthralgias and back pain.  Skin: Negative for color change and rash.  Neurological: Negative for seizures and syncope.  All other systems reviewed and are negative.    Physical Exam Triage Vital Signs ED Triage Vitals  Enc Vitals Group     BP 06/04/18 1239 125/82     Pulse Rate 06/04/18 1239 81     Resp 06/04/18 1239 18     Temp 06/04/18 1239 98.3 F (36.8 C)     Temp Source 06/04/18 1239 Oral     SpO2 06/04/18 1239 100 %     Weight --      Height --      Head Circumference --      Peak Flow --      Pain Score 06/04/18 1243 10     Pain Loc --      Pain Edu? --      Excl. in GC? --    No data found.  Updated Vital Signs BP 125/82 (BP Location: Left Arm)   Pulse 81   Temp 98.3 F (36.8 C) (Oral)   Resp 18   LMP 06/02/2018   SpO2 100%   Visual  Acuity Right Eye Distance:   Left Eye Distance:   Bilateral Distance:    Right Eye Near:   Left Eye Near:    Bilateral Near:     Physical Exam Constitutional:      General: She is not in acute distress.    Appearance: She is well-developed.  HENT:     Head: Normocephalic and atraumatic.     Mouth/Throat:   Eyes:     Conjunctiva/sclera: Conjunctivae normal.     Pupils: Pupils are equal, round, and reactive to light.  Neck:     Musculoskeletal: Normal range of motion.  Cardiovascular:     Rate and Rhythm: Normal rate.  Pulmonary:     Effort: Pulmonary effort is normal. No respiratory distress.  Abdominal:     General: There is no distension.     Palpations: Abdomen is soft.  Musculoskeletal: Normal range of motion.  Lymphadenopathy:     Cervical: Cervical adenopathy present.  Skin:    General: Skin is warm and dry.  Neurological:     Mental Status: She is alert.      UC Treatments / Results  Labs (all labs ordered are listed,  but only abnormal results are displayed) Labs Reviewed - No data to display  EKG None  Radiology No results found.  Procedures Procedures (including critical care time)  Medications Ordered in UC Medications - No data to display  Initial Impression / Assessment and Plan / UC Course  I have reviewed the triage vital signs and the nursing notes.  Pertinent labs & imaging results that were available during my care of the patient were reviewed by me and considered in my medical decision making (see chart for details).     Reviewed the importance of following up with a dentist.  The antibiotic and pain medicine are temporary fixes for her problem. Final Clinical Impressions(s) / UC Diagnoses   Final diagnoses:  Open fracture of tooth, initial encounter  Dental infection     Discharge Instructions     The is taken antibiotic 3 times a day.  Take with food Take Tylenol or ibuprofen for less severe pain. Take hydrocodone if needed for severe pain.  Take this also with food Do not drive while on hydrocodone Call tomorrow to get an appointment with dentist or oral surgeon   ED Prescriptions    Medication Sig Dispense Auth. Provider   clindamycin (CLEOCIN) 300 MG capsule Take 1 capsule (300 mg total) by mouth 3 (three) times daily. 30 capsule Eustace Moore, MD   HYDROcodone-acetaminophen (NORCO/VICODIN) 5-325 MG tablet Take 1-2 tablets by mouth every 6 (six) hours as needed for severe pain. 10 tablet Eustace Moore, MD     Controlled Substance Prescriptions [place] Controlled Substance Registry consulted? Yes   Eustace Moore, MD 06/04/18 1318

## 2018-06-04 NOTE — ED Triage Notes (Signed)
Pt c/o dental pain x1 week, states there was a "pus pocket that busted last night".

## 2018-06-04 NOTE — Discharge Instructions (Signed)
The is taken antibiotic 3 times a day.  Take with food Take Tylenol or ibuprofen for less severe pain. Take hydrocodone if needed for severe pain.  Take this also with food Do not drive while on hydrocodone Call tomorrow to get an appointment with dentist or oral surgeon

## 2018-09-07 DIAGNOSIS — A6004 Herpesviral vulvovaginitis: Secondary | ICD-10-CM | POA: Insufficient documentation

## 2019-03-03 ENCOUNTER — Other Ambulatory Visit: Payer: Self-pay

## 2019-03-03 ENCOUNTER — Encounter (HOSPITAL_BASED_OUTPATIENT_CLINIC_OR_DEPARTMENT_OTHER): Payer: Self-pay | Admitting: *Deleted

## 2019-03-03 ENCOUNTER — Emergency Department (HOSPITAL_BASED_OUTPATIENT_CLINIC_OR_DEPARTMENT_OTHER)
Admission: EM | Admit: 2019-03-03 | Discharge: 2019-03-03 | Disposition: A | Discharge status: Home / Self Care | Payer: Medicaid Other | Attending: Emergency Medicine | Admitting: Emergency Medicine

## 2019-03-03 DIAGNOSIS — N3001 Acute cystitis with hematuria: Secondary | ICD-10-CM

## 2019-03-03 DIAGNOSIS — F17210 Nicotine dependence, cigarettes, uncomplicated: Secondary | ICD-10-CM | POA: Insufficient documentation

## 2019-03-03 DIAGNOSIS — R300 Dysuria: Secondary | ICD-10-CM | POA: Diagnosis present

## 2019-03-03 LAB — URINALYSIS, ROUTINE W REFLEX MICROSCOPIC
Bilirubin Urine: NEGATIVE
Glucose, UA: NEGATIVE mg/dL
Ketones, ur: NEGATIVE mg/dL
Nitrite: NEGATIVE
Protein, ur: NEGATIVE mg/dL
Specific Gravity, Urine: 1.025 (ref 1.005–1.030)
pH: 6 (ref 5.0–8.0)

## 2019-03-03 LAB — PREGNANCY, URINE: Preg Test, Ur: NEGATIVE

## 2019-03-03 LAB — URINALYSIS, MICROSCOPIC (REFLEX)

## 2019-03-03 MED ORDER — SULFAMETHOXAZOLE-TRIMETHOPRIM 800-160 MG PO TABS: 1.0000 | ORAL_TABLET | Freq: Two times a day (BID) | ORAL | 0 refills | Status: AC

## 2019-03-03 MED ORDER — SULFAMETHOXAZOLE-TRIMETHOPRIM 800-160 MG PO TABS
1.0000 | ORAL_TABLET | Freq: Once | ORAL | Status: AC
  Administered 2019-03-03: 1 via ORAL
  Filled 2019-03-03: qty 1

## 2019-03-03 NOTE — Discharge Instructions (Addendum)
Take the antibiotics as prescribed.  You may also take Tylenol and ibuprofen.  Your pregnancy test was negative.  If you develop worsening pain, pain located your pelvic region, pain located to your right lower quadrant, vaginal discharge, concerns for STDs return to the emergency department or may follow with your OB/GYN.

## 2019-03-03 NOTE — ED Provider Notes (Signed)
[facility] EMERGENCY DEPARTMENT Provider Note   CSN: [id number] Arrival date & time: 03/03/19  2036    History Chief Complaint  Patient presents with  . Abdominal Pain    Tammy Hale is a 34 y.o. female with history significant for lupus who presents for evaluation of dysuria.  Patient states she has had burning with urination x2 days.  States she has had some suprapubic pain today.  Pain will occasionally radiate around into her lower back.  She denies any flank pain, known history of stones.  Denies chance of pregnancy.  She is not currently sexually active.  She has no vaginal discharge, pelvic pain or concerns for STDs.  Denies fever, chills, nausea, vomiting,, diarrhea, constipation.  Rates her current pain a 4/10.  She does not anything for pain at this time.  Denies additional aggravating or alleviating factors. No hematuria LMP 02/16/19. No currently sexually active.  History obtained from patient and past medical records.  No interpreter is used.  HPI     Past Medical History:  Diagnosis Date  . Lupus [facility])     Patient Active Problem List   Diagnosis Date Noted  . Moderate cervical atypia 08/24/2017    Past Surgical History:  Procedure Laterality Date  . HERNIA REPAIR       OB History   No obstetric history on file.     No family history on file.  Social History   Tobacco Use  . Smoking status: Current Every Day Smoker    Packs/day: 0.50    Types: Cigarettes  . Smokeless tobacco: Never Used  Substance Use Topics  . Alcohol use: No  . Drug use: Yes    Types: Marijuana    Home Medications Prior to Admission medications   Medication Sig Start Date End Date Taking? Authorizing Provider  clindamycin (CLEOCIN) 300 MG capsule Take 1 capsule (300 mg total) by mouth 3 (three) times daily. 06/04/18   Raylene Everts, MD  HYDROcodone-acetaminophen (NORCO/VICODIN) 5-325 MG tablet Take 1-2 tablets by mouth every 6 (six) hours as needed for severe  pain. 06/04/18   Raylene Everts, MD  sulfamethoxazole-trimethoprim (BACTRIM DS) 800-160 MG tablet Take 1 tablet by mouth 2 (two) times daily for 7 days. 03/03/19 03/10/19  Chamar Broughton A, PA-C    Allergies    Penicillins  Review of Systems   Review of Systems  Constitutional: Negative.   HENT: Negative.   Respiratory: Negative.   Cardiovascular: Negative.   Gastrointestinal: Negative.   Genitourinary: Positive for dysuria, frequency and urgency. Negative for decreased urine volume, difficulty urinating, dyspareunia, enuresis, flank pain, genital sores, hematuria, menstrual problem, pelvic pain, vaginal bleeding, vaginal discharge and vaginal pain.  Musculoskeletal: Negative.   Skin: Negative.   Neurological: Negative.   All other systems reviewed and are negative.   Physical Exam Updated Vital Signs BP 111/71 (BP Location: Right Arm)   Pulse 71   Temp 98.7 F (37.1 C) (Oral)   Resp 16   LMP 02/09/2019   SpO2 100%   Physical Exam Vitals and nursing note reviewed.  Constitutional:      General: She is not in acute distress.    Appearance: She is well-developed. She is not ill-appearing, toxic-appearing or diaphoretic.  HENT:     Head: Normocephalic and atraumatic.     Mouth/Throat:     Mouth: Mucous membranes are moist.  Eyes:     Pupils: Pupils are equal, round, and reactive to light.  Cardiovascular:  Rate and Rhythm: Normal rate.     Heart sounds: Normal heart sounds.  Pulmonary:     Effort: Pulmonary effort is normal. No respiratory distress.     Breath sounds: Normal breath sounds and air entry.     Comments: Speaks in full sentences without difficulty Abdominal:     General: Bowel sounds are normal. There is no distension.     Palpations: Abdomen is soft.     Tenderness: There is abdominal tenderness in the suprapubic area. There is no right CVA tenderness, left CVA tenderness, guarding or rebound. Negative signs include Murphy's sign and McBurney's  sign.     Hernia: No hernia is present.     Comments: Soft, tenderness to suprapubic region.  No focal right, left lower quadrant tenderness palpation.  No rebound or guarding.  Normoactive bowel sounds.  Musculoskeletal:        General: Normal range of motion.     Cervical back: Normal range of motion.     Comments: Moves all 4 extremities without difficulty.  Skin:    General: Skin is warm and dry.     Capillary Refill: Capillary refill takes less than 2 seconds.     Comments: Brisk capillary refill.  No edema erythema or warmth.  No fluctuance or induration.  No contusions, abrasions or lacerations  Neurological:     Mental Status: She is alert.     Comments: Ambulatory in room without difficulty.     ED Results / Procedures / Treatments   Labs (all labs ordered are listed, but only abnormal results are displayed) Labs Reviewed  URINALYSIS, ROUTINE W REFLEX MICROSCOPIC - Abnormal; Notable for the following components:      Result Value   Hgb urine dipstick SMALL (*)    Leukocytes,Ua TRACE (*)    All other components within normal limits  URINALYSIS, MICROSCOPIC (REFLEX) - Abnormal; Notable for the following components:   Bacteria, UA MANY (*)    All other components within normal limits  URINE CULTURE  PREGNANCY, URINE    EKG None  Radiology No results found.  Procedures Procedures (including critical care time)  Medications Ordered in ED Medications  sulfamethoxazole-trimethoprim (BACTRIM DS) 800-160 MG per tablet 1 tablet (has no administration in time range)   ED Course  I have reviewed the triage vital signs and the nursing notes.  Pertinent labs & imaging results that were available during my care of the patient were reviewed by me and considered in my medical decision making (see chart for details).  34 year old female appears otherwise well presents for evaluation of dysuria and suprapubic pain. Symptoms x2 days.  She is afebrile, nonseptic, non-ill  appearing.  She has no concerns for STDs, no pelvic pain, vaginal discharge.  Has suprapubic pain will occasionally radiate into her lower back however negative CVA tap bilaterally. No overlying skin changes. No red flags for back pain. No hematuria, no history of stones or pyelonephritis.  She is tolerating p.o. intake at home without difficulty.  Heart and lungs clear.   Urine pregnancy negative Urinalysis with many bacteria, trace leuks, small blood, nitrite negative. Urine culture sent.  Discussed with patient results of UTI. She continues to not have concerns for STI and declines pelvic exam.  On reevaluation abdomen soft, nontender.  She has no focal pain, specifically no pain located to right lower quadrant, left lower quadrant.  I have low suspicion for PID, torsion, TOA, appendicitis, bowel obstruction.  Patient is nontoxic, nonseptic appearing, in no  apparent distress.  Patient's pain and other symptoms adequately managed in emergency department.  Fluid bolus given.  Labs, imaging and vitals reviewed.  Patient does not meet the SIRS or Sepsis criteria.  On repeat exam patient does not have a surgical abdomin and there are no peritoneal signs.  No indication of appendicitis, bowel obstruction, bowel perforation, cholecystitis, diverticulitis, PID or ectopic pregnancy.  Patient discharged home with symptomatic treatment and given strict instructions for follow-up with their primary care physician.  I have also discussed reasons to return immediately to the ER.  Patient expresses understanding and agrees with plan.    MDM Rules/Calculators/A&P                       Final Clinical Impression(s) / ED Diagnoses Final diagnoses:  Acute cystitis with hematuria    Rx / DC Orders ED Discharge Orders         Ordered    sulfamethoxazole-trimethoprim (BACTRIM DS) 800-160 MG tablet  2 times daily     03/03/19 2133           Athel Merriweather A, PA-C 03/03/19 2137    Virgina Norfolk,  DO 03/03/19 2139

## 2019-03-03 NOTE — ED Triage Notes (Signed)
Pt reports suprapubic pain and low back pain x 2 days with dysuria. Denies vaginal discharge

## 2019-03-03 NOTE — ED Notes (Signed)
Notified phyllis in lab to add on urine culture. 

## 2019-03-05 LAB — URINE CULTURE

## 2019-08-05 ENCOUNTER — Other Ambulatory Visit: Payer: Self-pay

## 2019-08-05 ENCOUNTER — Encounter (HOSPITAL_BASED_OUTPATIENT_CLINIC_OR_DEPARTMENT_OTHER): Payer: Self-pay | Admitting: Emergency Medicine

## 2019-08-05 ENCOUNTER — Emergency Department (HOSPITAL_BASED_OUTPATIENT_CLINIC_OR_DEPARTMENT_OTHER)
Admission: EM | Admit: 2019-08-05 | Discharge: 2019-08-06 | Disposition: A | Discharge status: Home / Self Care | Payer: Medicaid Other | Attending: Emergency Medicine | Admitting: Emergency Medicine

## 2019-08-05 DIAGNOSIS — N898 Other specified noninflammatory disorders of vagina: Secondary | ICD-10-CM | POA: Diagnosis not present

## 2019-08-05 DIAGNOSIS — R300 Dysuria: Secondary | ICD-10-CM

## 2019-08-05 DIAGNOSIS — F1290 Cannabis use, unspecified, uncomplicated: Secondary | ICD-10-CM | POA: Insufficient documentation

## 2019-08-05 DIAGNOSIS — R109 Unspecified abdominal pain: Secondary | ICD-10-CM | POA: Diagnosis present

## 2019-08-05 DIAGNOSIS — Z880 Allergy status to penicillin: Secondary | ICD-10-CM | POA: Diagnosis not present

## 2019-08-05 DIAGNOSIS — F17210 Nicotine dependence, cigarettes, uncomplicated: Secondary | ICD-10-CM | POA: Diagnosis not present

## 2019-08-05 LAB — URINALYSIS, ROUTINE W REFLEX MICROSCOPIC
Bilirubin Urine: NEGATIVE
Glucose, UA: NEGATIVE mg/dL
Ketones, ur: NEGATIVE mg/dL
Nitrite: NEGATIVE
Protein, ur: NEGATIVE mg/dL
Specific Gravity, Urine: 1.02 (ref 1.005–1.030)
pH: 7 (ref 5.0–8.0)

## 2019-08-05 LAB — WET PREP, GENITAL
Clue Cells Wet Prep HPF POC: NONE SEEN
Sperm: NONE SEEN
Trich, Wet Prep: NONE SEEN
Yeast Wet Prep HPF POC: NONE SEEN

## 2019-08-05 LAB — URINALYSIS, MICROSCOPIC (REFLEX)

## 2019-08-05 LAB — PREGNANCY, URINE: Preg Test, Ur: NEGATIVE

## 2019-08-05 MED ORDER — AZITHROMYCIN 250 MG PO TABS
1000.0000 mg | ORAL_TABLET | Freq: Once | ORAL | Status: AC
  Administered 2019-08-06: 1000 mg via ORAL
  Filled 2019-08-05: qty 4

## 2019-08-05 MED ORDER — DOXYCYCLINE HYCLATE 100 MG PO TABS
100.0000 mg | ORAL_TABLET | Freq: Once | ORAL | Status: AC
  Administered 2019-08-06: 100 mg via ORAL
  Filled 2019-08-05: qty 1

## 2019-08-05 NOTE — ED Triage Notes (Signed)
Low abd pain with vaginal discharge x 3 days.

## 2019-08-06 MED ORDER — DOXYCYCLINE HYCLATE 100 MG PO CAPS
100.0000 mg | ORAL_CAPSULE | Freq: Two times a day (BID) | ORAL | 0 refills | Status: DC
Start: 2019-08-06 — End: 2019-11-14

## 2019-08-06 NOTE — ED Provider Notes (Signed)
[facility] EMERGENCY DEPARTMENT Provider Note   CSN: [id number] Arrival date & time: 08/05/19  1833     History Chief Complaint  Patient presents with  . Abdominal Pain  . Vaginal Discharge    Tammy Hale is a 34 y.o. female.  HPI     This is a 34 year old female who presents with abdominal discomfort and dysuria.  Patient reports that she has lower abdominal pressure and discomfort especially with urination.  She reports burning with urination.  She believes she has a UTI.  She also reports some vaginal discharge.  She states that she has 1 sexual partner and they are monogamous.  Inconsistent condom use.  She did take a pregnancy test that was negative.  Last menstrual period was 1 month ago.  Denies any back pain or fevers.  No nausea, vomiting, diarrhea.  Rates her pain at 5 out of 10 and it is worse with urination.  She has not taken anything for her pain.  Past Medical History:  Diagnosis Date  . Lupus [facility])     Patient Active Problem List   Diagnosis Date Noted  . Moderate cervical atypia 08/24/2017    Past Surgical History:  Procedure Laterality Date  . HERNIA REPAIR       OB History   No obstetric history on file.     No family history on file.  Social History   Tobacco Use  . Smoking status: Current Every Day Smoker    Packs/day: 0.50    Types: Cigarettes  . Smokeless tobacco: Never Used  Vaping Use  . Vaping Use: Never used  Substance Use Topics  . Alcohol use: No  . Drug use: Yes    Types: Marijuana    Home Medications Prior to Admission medications   Medication Sig Start Date End Date Taking? Authorizing Provider  clindamycin (CLEOCIN) 300 MG capsule Take 1 capsule (300 mg total) by mouth 3 (three) times daily. 06/04/18   Eustace Moore, MD  doxycycline (VIBRAMYCIN) 100 MG capsule Take 1 capsule (100 mg total) by mouth 2 (two) times daily. 08/06/19   Prajna Vanderpool, Mayer Masker, MD  HYDROcodone-acetaminophen (NORCO/VICODIN) 5-325 MG  tablet Take 1-2 tablets by mouth every 6 (six) hours as needed for severe pain. 06/04/18   Eustace Moore, MD    Allergies    Penicillins  Review of Systems   Review of Systems  Constitutional: Negative for fever.  Respiratory: Negative for shortness of breath.   Cardiovascular: Negative for chest pain.  Gastrointestinal: Positive for abdominal pain. Negative for diarrhea, nausea and vomiting.  Genitourinary: Positive for dysuria and vaginal discharge.  All other systems reviewed and are negative.   Physical Exam Updated Vital Signs BP 125/75 (BP Location: Right Arm)   Pulse (!) 59   Temp 98.6 F (37 C) (Oral)   Resp 17   Ht 1.676 m (5\' 6" )   Wt 59.4 kg   SpO2 100%   BMI 21.14 kg/m   Physical Exam Vitals and nursing note reviewed.  Constitutional:      Appearance: She is well-developed.  HENT:     Head: Normocephalic and atraumatic.  Eyes:     Pupils: Pupils are equal, round, and reactive to light.  Cardiovascular:     Rate and Rhythm: Normal rate and regular rhythm.     Heart sounds: Normal heart sounds.  Pulmonary:     Effort: Pulmonary effort is normal. No respiratory distress.     Breath sounds: No wheezing.  Abdominal:     General: Bowel sounds are normal.     Palpations: Abdomen is soft.     Tenderness: There is no abdominal tenderness. There is no guarding or rebound.  Genitourinary:    Comments: Normal external vaginal exam, no cervical friability noted, moderate white vaginal discharge noted Musculoskeletal:     Cervical back: Neck supple.  Skin:    General: Skin is warm and dry.  Neurological:     Mental Status: She is alert and oriented to person, place, and time.  Psychiatric:        Mood and Affect: Mood normal.     ED Results / Procedures / Treatments   Labs (all labs ordered are listed, but only abnormal results are displayed) Labs Reviewed  WET PREP, GENITAL - Abnormal; Notable for the following components:      Result Value   WBC,  Wet Prep HPF POC MODERATE (*)    All other components within normal limits  URINALYSIS, ROUTINE W REFLEX MICROSCOPIC - Abnormal; Notable for the following components:   Hgb urine dipstick SMALL (*)    Leukocytes,Ua TRACE (*)    All other components within normal limits  URINALYSIS, MICROSCOPIC (REFLEX) - Abnormal; Notable for the following components:   Bacteria, UA MANY (*)    All other components within normal limits  URINE CULTURE  PREGNANCY, URINE  GC/CHLAMYDIA PROBE AMP ([facility]) NOT AT [facility]    EKG None  Radiology No results found.  Procedures Procedures (including critical care time)  Medications Ordered in ED Medications  doxycycline (VIBRA-TABS) tablet 100 mg (has no administration in time range)  azithromycin (ZITHROMAX) tablet 1,000 mg (has no administration in time range)    ED Course  I have reviewed the triage vital signs and the nursing notes.  Pertinent labs & imaging results that were available during my care of the patient were reviewed by me and considered in my medical decision making (see chart for details).    MDM Rules/Calculators/A&P                           Patient presents with dysuria.  Concern for UTI.  Overall nontoxic and vital signs are reassuring.  Considerations include but not limited to, UTI, pelvic inflammatory disease or STD.  Abdomen is relatively nontender, low suspicion for appendicitis or other intra-abdominal etiology.  Urinalysis shows trace leukocyte esterase, 0-5 white cells, many bacteria.  This is not fully classic for UTI; however, given patient's symptoms, will culture.  Patient was tested and treated for STDs.  She has anaphylaxis to penicillins.  Unclear whether she tolerates cephalosporins.  There is reason she was given a gram of azithromycin and doxycycline.  Will discharge with doxycycline.  This should cover for possible UTI as well until culture results return.  Patient was given strict return precautions.  After  history, exam, and medical workup I feel the patient has been appropriately medically screened and is safe for discharge home. Pertinent diagnoses were discussed with the patient. Patient was given return precautions.   Final Clinical Impression(s) / ED Diagnoses Final diagnoses:  Dysuria  Vaginal discharge    Rx / DC Orders ED Discharge Orders         Ordered    doxycycline (VIBRAMYCIN) 100 MG capsule  2 times daily     Discontinue  Reprint     08/06/19 0002           Aldwin Micalizzi, Toni Amend  F, MD 08/06/19 7703

## 2019-08-06 NOTE — Discharge Instructions (Addendum)
You were seen today for dysuria and vaginal discharge.  You were tested and treated for STDs.  A urine culture was sent.  Take antibiotics as instructed.  If you develop fevers or back pain, you should be reevaluated.

## 2019-08-07 LAB — GC/CHLAMYDIA PROBE AMP (~~LOC~~) NOT AT ARMC
Chlamydia: NEGATIVE
Comment: NEGATIVE
Comment: NORMAL
Neisseria Gonorrhea: NEGATIVE

## 2019-08-07 LAB — URINE CULTURE: Culture: <10000 — AB

## 2019-10-12 ENCOUNTER — Emergency Department (HOSPITAL_BASED_OUTPATIENT_CLINIC_OR_DEPARTMENT_OTHER)
Admission: EM | Admit: 2019-10-12 | Discharge: 2019-10-12 | Disposition: A | Discharge status: Home / Self Care | Payer: Medicaid Other | Attending: Emergency Medicine | Admitting: Emergency Medicine

## 2019-10-12 ENCOUNTER — Other Ambulatory Visit: Payer: Self-pay

## 2019-10-12 ENCOUNTER — Encounter (HOSPITAL_BASED_OUTPATIENT_CLINIC_OR_DEPARTMENT_OTHER): Payer: Self-pay | Admitting: *Deleted

## 2019-10-12 DIAGNOSIS — Z79899 Other long term (current) drug therapy: Secondary | ICD-10-CM | POA: Diagnosis not present

## 2019-10-12 DIAGNOSIS — S39012A Strain of muscle, fascia and tendon of lower back, initial encounter: Secondary | ICD-10-CM | POA: Diagnosis not present

## 2019-10-12 DIAGNOSIS — F17210 Nicotine dependence, cigarettes, uncomplicated: Secondary | ICD-10-CM | POA: Diagnosis not present

## 2019-10-12 DIAGNOSIS — Y929 Unspecified place or not applicable: Secondary | ICD-10-CM | POA: Diagnosis not present

## 2019-10-12 DIAGNOSIS — S3992XA Unspecified injury of lower back, initial encounter: Secondary | ICD-10-CM | POA: Diagnosis present

## 2019-10-12 DIAGNOSIS — Y999 Unspecified external cause status: Secondary | ICD-10-CM | POA: Diagnosis not present

## 2019-10-12 DIAGNOSIS — Y939 Activity, unspecified: Secondary | ICD-10-CM | POA: Diagnosis not present

## 2019-10-12 DIAGNOSIS — N898 Other specified noninflammatory disorders of vagina: Secondary | ICD-10-CM | POA: Diagnosis not present

## 2019-10-12 DIAGNOSIS — X58XXXA Exposure to other specified factors, initial encounter: Secondary | ICD-10-CM | POA: Insufficient documentation

## 2019-10-12 DIAGNOSIS — R109 Unspecified abdominal pain: Secondary | ICD-10-CM | POA: Insufficient documentation

## 2019-10-12 DIAGNOSIS — R319 Hematuria, unspecified: Secondary | ICD-10-CM | POA: Diagnosis not present

## 2019-10-12 DIAGNOSIS — R300 Dysuria: Secondary | ICD-10-CM

## 2019-10-12 LAB — URINALYSIS, MICROSCOPIC (REFLEX)

## 2019-10-12 LAB — URINALYSIS, ROUTINE W REFLEX MICROSCOPIC
Bilirubin Urine: NEGATIVE
Glucose, UA: NEGATIVE mg/dL
Ketones, ur: NEGATIVE mg/dL
Leukocytes,Ua: NEGATIVE
Nitrite: NEGATIVE
Protein, ur: NEGATIVE mg/dL
Specific Gravity, Urine: 1.02 (ref 1.005–1.030)
pH: 6.5 (ref 5.0–8.0)

## 2019-10-12 LAB — PREGNANCY, URINE: Preg Test, Ur: NEGATIVE

## 2019-10-12 MED ORDER — NITROFURANTOIN MONOHYD MACRO 100 MG PO CAPS
100.0000 mg | ORAL_CAPSULE | Freq: Two times a day (BID) | ORAL | 0 refills | Status: AC
Start: 2019-10-12 — End: 2019-10-17

## 2019-10-12 NOTE — ED Provider Notes (Signed)
[facility] EMERGENCY DEPARTMENT Provider Note   CSN: [id number] Arrival date & time: 10/12/19  1113     History Chief Complaint  Patient presents with  . Back Pain    Tammy Hale is a 34 y.o. female.  HPI Patient is a 34 year old female with a medical history as noted below.  Patient presents today due to multiple complaints.  Patient states she woke this morning with very mild suprapubic pain as well as mild low back pain.  She states it has been just over 1 month since her last menstrual cycle and is sexually active and is concerned she could be pregnant.  She does report a small amount of clear vaginal discharge which she states is baseline for her and has not acutely worsened.  Denies any foul smelling discharge.  She reports a mild "tingling" sensation when urinating.  No significant dysuria.  No hematuria.  No chest pain, shortness of breath, nausea, vomiting, fevers, chills.    Past Medical History:  Diagnosis Date  . Lupus [facility])     Patient Active Problem List   Diagnosis Date Noted  . Moderate cervical atypia 08/24/2017    Past Surgical History:  Procedure Laterality Date  . HERNIA REPAIR       OB History   No obstetric history on file.     No family history on file.  Social History   Tobacco Use  . Smoking status: Current Every Day Smoker    Packs/day: 0.50    Types: Cigarettes  . Smokeless tobacco: Never Used  Vaping Use  . Vaping Use: Never used  Substance Use Topics  . Alcohol use: No  . Drug use: Yes    Types: Marijuana    Home Medications Prior to Admission medications   Medication Sig Start Date End Date Taking? Authorizing Provider  clindamycin (CLEOCIN) 300 MG capsule Take 1 capsule (300 mg total) by mouth 3 (three) times daily. 06/04/18   Eustace Moore, MD  doxycycline (VIBRAMYCIN) 100 MG capsule Take 1 capsule (100 mg total) by mouth 2 (two) times daily. 08/06/19   Horton, Mayer Masker, MD  HYDROcodone-acetaminophen  (NORCO/VICODIN) 5-325 MG tablet Take 1-2 tablets by mouth every 6 (six) hours as needed for severe pain. 06/04/18   Eustace Moore, MD    Allergies    Penicillins  Review of Systems   Review of Systems  Constitutional: Negative for fever.  Respiratory: Negative for shortness of breath.   Cardiovascular: Negative for chest pain.  Gastrointestinal: Positive for abdominal pain. Negative for nausea and vomiting.  Genitourinary: Positive for dysuria and vaginal discharge (chronic). Negative for difficulty urinating, hematuria and vaginal bleeding.  Musculoskeletal: Positive for back pain.    Physical Exam Updated Vital Signs BP 115/71 (BP Location: Right Arm)   Pulse (!) 59   Temp 98.7 F (37.1 C) (Oral)   Resp 16   Ht 5\' 6"  (1.676 m)   Wt 60.8 kg   SpO2 100%   BMI 21.63 kg/m   Physical Exam Vitals and nursing note reviewed.  Constitutional:      General: She is not in acute distress.    Appearance: Normal appearance. She is not ill-appearing, toxic-appearing or diaphoretic.  HENT:     Head: Normocephalic and atraumatic.     Right Ear: External ear normal.     Left Ear: External ear normal.     Nose: Nose normal.     Mouth/Throat:     Mouth: Mucous membranes are moist.  Pharynx: Oropharynx is clear. No oropharyngeal exudate or posterior oropharyngeal erythema.  Eyes:     Extraocular Movements: Extraocular movements intact.  Cardiovascular:     Rate and Rhythm: Normal rate and regular rhythm.     Pulses: Normal pulses.     Heart sounds: Normal heart sounds. No murmur heard.  No friction rub. No gallop.   Pulmonary:     Effort: Pulmonary effort is normal. No respiratory distress.     Breath sounds: Normal breath sounds. No stridor. No wheezing, rhonchi or rales.  Abdominal:     General: Abdomen is flat.     Palpations: Abdomen is soft.     Tenderness: There is abdominal tenderness.     Comments: Abdomen is soft.  Very mild tenderness noted with deep palpation  along the suprapubic region.  No other regions of tenderness.  Musculoskeletal:        General: Tenderness present. Normal range of motion.     Cervical back: Normal range of motion and neck supple. No tenderness.     Comments: Very mild TTP noted along the sacral region.  No crepitus.  No edema.  No palpable muscle spasm.  Full range of motion of the spine.  Distal sensation intact.  Strength 5 out of 5 in the bilateral lower extremities.  Skin:    General: Skin is warm and dry.  Neurological:     General: No focal deficit present.     Mental Status: She is alert and oriented to person, place, and time.  Psychiatric:        Mood and Affect: Mood normal.        Behavior: Behavior normal.    ED Results / Procedures / Treatments   Labs (all labs ordered are listed, but only abnormal results are displayed) Labs Reviewed  URINALYSIS, ROUTINE W REFLEX MICROSCOPIC - Abnormal; Notable for the following components:      Result Value   Hgb urine dipstick SMALL (*)    All other components within normal limits  URINALYSIS, MICROSCOPIC (REFLEX) - Abnormal; Notable for the following components:   Bacteria, UA MANY (*)    All other components within normal limits  PREGNANCY, URINE   EKG None  Radiology No results found.  Procedures Procedures (including critical care time)  Medications Ordered in ED Medications - No data to display  ED Course  I have reviewed the triage vital signs and the nursing notes.  Pertinent labs & imaging results that were available during my care of the patient were reviewed by me and considered in my medical decision making (see chart for details).  Clinical Course as of Oct 11 1620  Fri Oct 12, 2019  1601 Hgb urine dipstick(!): SMALL [LJ]  1609 Bacteria, UA(!): MANY [LJ]  1609 Preg Test, Ur: NEGATIVE [LJ]    Clinical Course User Index [LJ] Placido Sou, PA-C   MDM Rules/Calculators/A&P                          Pt is a 34 y.o. female that  presents with a history, physical exam, and ED Clinical Course as noted above.   Patient presents today with multiple complaints.  Patient presents today for pregnancy test as well as symptoms of mild dysuria as well as mild low back pain.  Physical exam is extremely reassuring.  She has neurovascularly intact in the bilateral lower extremities.  There is minimal tenderness along the suprapubic region, otherwise no regions of  tenderness in the abdomen.  Abdomen is soft.  She is afebrile.  Not tachycardic.  Normotensive. Pregnancy test is negative.   Many bacteria noted on her UA.  Negative leukocytes or nitrites.  Given she is symptomatic going to discharge patient on a short course of nitrofurantoin.  Patient does note some baseline vaginal discharge which is chronic for her and denies it is acutely worsened.  Do not feel that pelvic exam as well as STD testing is warranted and patient agrees.  Continued use of Tylenol and ibuprofen for back pain.  Patient is hemodynamically stable and in NAD at the time of d/c. Evaluation does not show pathology that would require ongoing emergent intervention or inpatient treatment. I explained the diagnosis to the patient. Patient is comfortable with above plan and is stable for discharge at this time. All questions were answered prior to disposition. Strict return precautions for returning to the ED were discussed. Encouraged follow up with PCP.    An After Visit Summary was printed and given to the patient.  Patient discharged to home/self care.  Condition at discharge: Stable  Note: Portions of this report may have been transcribed using voice recognition software. Every effort was made to ensure accuracy; however, inadvertent computerized transcription errors may be present.   Final Clinical Impression(s) / ED Diagnoses Final diagnoses:  Dysuria  Strain of lumbar region, initial encounter    Rx / DC Orders ED Discharge Orders         Ordered     nitrofurantoin, macrocrystal-monohydrate, (MACROBID) 100 MG capsule  2 times daily        10/12/19 1617           Placido Sou, PA-C 10/12/19 1632    Melene Plan, DO 10/12/19 1642

## 2019-10-12 NOTE — ED Triage Notes (Signed)
Back and abd pain w vag dc states last period was 2 months ago  Woke this am w abd cramping

## 2019-10-12 NOTE — Discharge Instructions (Addendum)
I am prescribing a medication called Macrobid.  You are going to take this twice a day for the next 5 days.  Please do not stop taking it early.  You can take Tylenol or ibuprofen for management of your back pain.  Please follow the instructions on the bottle.  You can always return to the emergency department with new or worsening symptoms.  It was a pleasure to meet you.

## 2019-11-14 ENCOUNTER — Encounter (HOSPITAL_COMMUNITY): Payer: Self-pay | Admitting: Emergency Medicine

## 2019-11-14 ENCOUNTER — Other Ambulatory Visit: Payer: Self-pay

## 2019-11-14 ENCOUNTER — Ambulatory Visit (HOSPITAL_COMMUNITY)
Admission: EM | Admit: 2019-11-14 | Discharge: 2019-11-14 | Disposition: A | Discharge status: Home / Self Care | Payer: Medicaid Other | Attending: Family | Admitting: Family

## 2019-11-14 DIAGNOSIS — Z3202 Encounter for pregnancy test, result negative: Secondary | ICD-10-CM

## 2019-11-14 DIAGNOSIS — R109 Unspecified abdominal pain: Secondary | ICD-10-CM | POA: Diagnosis not present

## 2019-11-14 DIAGNOSIS — N912 Amenorrhea, unspecified: Secondary | ICD-10-CM | POA: Diagnosis not present

## 2019-11-14 DIAGNOSIS — N898 Other specified noninflammatory disorders of vagina: Secondary | ICD-10-CM

## 2019-11-14 DIAGNOSIS — Z202 Contact with and (suspected) exposure to infections with a predominantly sexual mode of transmission: Secondary | ICD-10-CM

## 2019-11-14 DIAGNOSIS — R1030 Lower abdominal pain, unspecified: Secondary | ICD-10-CM

## 2019-11-14 LAB — POC URINE PREG, ED: Preg Test, Ur: NEGATIVE

## 2019-11-14 LAB — POCT URINALYSIS DIPSTICK, ED / UC
Bilirubin Urine: NEGATIVE
Glucose, UA: NEGATIVE mg/dL
Ketones, ur: NEGATIVE mg/dL
Leukocytes,Ua: NEGATIVE
Nitrite: NEGATIVE
Protein, ur: NEGATIVE mg/dL
Specific Gravity, Urine: 1.025 (ref 1.005–1.030)
Urobilinogen, UA: 1 mg/dL (ref 0.0–1.0)
pH: 6 (ref 5.0–8.0)

## 2019-11-14 NOTE — Discharge Instructions (Addendum)
Recommend take Aleve 1 tablet every 12 hours as needed for pain/cramping. Recommend no sexual intercourse for at least 7 days. Encouraged to use condoms with each and every future sexual encounter. Follow-up pending lab results and with your PCP as planned in 1 month.

## 2019-11-14 NOTE — ED Provider Notes (Signed)
[facility]    CSN: [id number] Arrival date & time: 11/14/19  0941      History   Chief Complaint Chief Complaint  Patient presents with  . Abdominal Pain  . Amenorrhea    HPI Tammy Hale is a 34 y.o. female.   34 year old female presents with lower abdominal pain and cramping for the past 3 months. Also having some recent white vaginal discharge but denies any odor. No distinct pain when urinating. She has had some bouts of nausea and has vomited a few times. Last episode was 2 days ago. Denies any fever or back pain. Was seen on 10/12/2019 at [facility] ER for possible UTI (reviewed records). Was prescribed Macrobid and finished medication but still having symptoms. Was not tested for STD's at that time and concerned about infection since she has had unprotected intercourse. Also has not had a period for over 3 months. Was on DepoProvera with the last injection about 4 to 5 months ago. In the past, she has continued to have a period on Depo so not sure if increased stress lately is causing cramping and the amenorrhea. She has taken home pregnancy tests that have been negative but requests testing for pregnancy and STD's today. No other chronic health issues. Takes no daily medication.   The history is provided by the patient.    Past Medical History:  Diagnosis Date  . Lupus [facility])     Patient Active Problem List   Diagnosis Date Noted  . Moderate cervical atypia 08/24/2017    Past Surgical History:  Procedure Laterality Date  . HERNIA REPAIR      OB History   No obstetric history on file.      Home Medications    Prior to Admission medications   Not on File    Family History Family History  Family history unknown: Yes    Social History Social History   Tobacco Use  . Smoking status: Current Every Day Smoker    Packs/day: 0.50    Types: Cigarettes  . Smokeless tobacco: Never Used  Vaping Use  . Vaping Use: Never used  Substance Use Topics   . Alcohol use: No  . Drug use: Yes    Types: Marijuana     Allergies   Penicillins   Review of Systems Review of Systems  Constitutional: Negative for activity change, appetite change, chills, fatigue and fever.  HENT: Negative for mouth sores and sore throat.   Respiratory: Negative for chest tightness and shortness of breath.   Gastrointestinal: Positive for abdominal pain, nausea and vomiting. Negative for diarrhea.  Genitourinary: Positive for frequency, menstrual problem, pelvic pain and vaginal discharge. Negative for decreased urine volume, difficulty urinating, dysuria, flank pain, genital sores, hematuria and vaginal bleeding.  Musculoskeletal: Negative for arthralgias, back pain and myalgias.  Skin: Negative for color change and rash.  Allergic/Immunologic: Negative for environmental allergies, food allergies and immunocompromised state.  Neurological: Negative for tremors, seizures, syncope, light-headedness and headaches.  Hematological: Negative for adenopathy. Does not bruise/bleed easily.     Physical Exam Triage Vital Signs ED Triage Vitals  Enc Vitals Group     BP 11/14/19 1217 106/64     Pulse Rate 11/14/19 1217 66     Resp 11/14/19 1217 14     Temp 11/14/19 1217 98.5 F (36.9 C)     Temp Source 11/14/19 1217 Oral     SpO2 11/14/19 1217 99 %     Weight --  Height --      Head Circumference --      Peak Flow --      Pain Score 11/14/19 1220 4     Pain Loc --      Pain Edu? --      Excl. in GC? --    No data found.  Updated Vital Signs BP 106/64 (BP Location: Left Arm)   Pulse 66   Temp 98.5 F (36.9 C) (Oral)   Resp 14   SpO2 99%   Visual Acuity Right Eye Distance:   Left Eye Distance:   Bilateral Distance:    Right Eye Near:   Left Eye Near:    Bilateral Near:     Physical Exam Vitals and nursing note reviewed. Exam conducted with a chaperone present.  Constitutional:      General: She is awake. She is not in acute distress.     Appearance: She is well-developed and well-groomed. She is not ill-appearing.     Comments: She is sitting comfortably on the exam table in no acute distress.   HENT:     Head: Normocephalic and atraumatic.  Eyes:     Extraocular Movements: Extraocular movements intact.     Conjunctiva/sclera: Conjunctivae normal.  Cardiovascular:     Rate and Rhythm: Normal rate and regular rhythm.     Heart sounds: Normal heart sounds. No murmur heard.   Pulmonary:     Effort: Pulmonary effort is normal. No respiratory distress.     Breath sounds: Normal breath sounds and air entry. No decreased air movement. No decreased breath sounds, wheezing or rhonchi.  Abdominal:     General: Abdomen is flat. Bowel sounds are normal.     Palpations: Abdomen is soft.     Tenderness: There is abdominal tenderness in the suprapubic area and left lower quadrant. There is no right CVA tenderness, left CVA tenderness, guarding or rebound.  Genitourinary:    General: Normal vulva.     Exam position: Knee-chest position.     Pubic Area: No rash.      Labia:        Right: No rash, tenderness or lesion.        Left: No rash, tenderness or lesion.      Vagina: No foreign body. Vaginal discharge (white thin) present. No erythema, tenderness, bleeding or lesions.     Cervix: No cervical motion tenderness, discharge, friability, erythema or cervical bleeding.     Uterus: Tender.      Adnexa:        Right: Tenderness present. No mass.         Left: Tenderness present. No mass.    Musculoskeletal:        General: Normal range of motion.     Cervical back: Normal range of motion.  Lymphadenopathy:     Lower Body: No right inguinal adenopathy. No left inguinal adenopathy.  Skin:    General: Skin is warm and dry.     Findings: No erythema or rash.  Neurological:     General: No focal deficit present.     Mental Status: She is alert and oriented to person, place, and time.  Psychiatric:        Mood and Affect: Mood  normal.        Behavior: Behavior normal. Behavior is cooperative.        Thought Content: Thought content normal.        Judgment: Judgment normal.  UC Treatments / Results  Labs (all labs ordered are listed, but only abnormal results are displayed) Labs Reviewed  POCT URINALYSIS DIPSTICK, ED / UC - Abnormal; Notable for the following components:      Result Value   Hgb urine dipstick SMALL (*)    All other components within normal limits  POC URINE PREG, ED  CERVICOVAGINAL ANCILLARY ONLY    EKG   Radiology No results found.  Procedures Procedures (including critical care time)  Medications Ordered in UC Medications - No data to display  Initial Impression / Assessment and Plan / UC Course  I have reviewed the triage vital signs and the nursing notes.  Pertinent labs & imaging results that were available during my care of the patient were reviewed by me and considered in my medical decision making (see chart for details).    Reviewed negative urine pregnancy test with patient. Discussed urinalysis results- essentially normal except small amount of blood. Doubt UTI- did not send urine for culture. Discussed that cramping and discharge may be due to infection - patient wants to wait for results for treatment. Discussed that stress may be causing her to not have her period. Also even when patients have been on DepoProvera in the past, it can affect each person differently each time it is given and may cause secondary amenorrhea. Patient has appointment with her PCP/GYN next month. Recommend discuss amenorrhea at that time along with any additional testing/evaluation that may be needed.  Recommend take OTC Aleve 1 tablet every 12 hours as needed for pain/cramping. No sexual intercourse for at least 7 days. Encouraged to use condoms with each and every future sexual encounter. Follow-up pending lab results and with her PCP as planned.  Final Clinical Impressions(s) / UC  Diagnoses   Final diagnoses:  Lower abdominal pain  Vaginal discharge  Amenorrhea  Potential exposure to STD     Discharge Instructions     Recommend take Aleve 1 tablet every 12 hours as needed for pain/cramping. Recommend no sexual intercourse for at least 7 days. Encouraged to use condoms with each and every future sexual encounter. Follow-up pending lab results and with your PCP as planned in 1 month.     ED Prescriptions    None     PDMP not reviewed this encounter.   Sudie Grumbling, NP 11/14/19 2001

## 2019-11-14 NOTE — ED Triage Notes (Addendum)
Pt c/o lower abd pain and cramping x 3 months. She states she has not had her period in 3 months. She has taken several home pregnancy test which have all been negative. Pt states she was on the depo shot 4-5 months ago and stopped it.

## 2020-03-04 ENCOUNTER — Ambulatory Visit (HOSPITAL_COMMUNITY)
Admission: EM | Admit: 2020-03-04 | Discharge: 2020-03-04 | Disposition: A | Discharge status: Home / Self Care | Payer: Medicaid Other | Attending: Family Medicine | Admitting: Family Medicine

## 2020-03-04 ENCOUNTER — Ambulatory Visit (INDEPENDENT_AMBULATORY_CARE_PROVIDER_SITE_OTHER): Payer: Medicaid Other

## 2020-03-04 ENCOUNTER — Encounter (HOSPITAL_COMMUNITY): Payer: Self-pay

## 2020-03-04 ENCOUNTER — Other Ambulatory Visit: Payer: Self-pay

## 2020-03-04 DIAGNOSIS — M79644 Pain in right finger(s): Secondary | ICD-10-CM

## 2020-03-04 DIAGNOSIS — W19XXXA Unspecified fall, initial encounter: Secondary | ICD-10-CM | POA: Diagnosis not present

## 2020-03-04 DIAGNOSIS — M25422 Effusion, left elbow: Secondary | ICD-10-CM | POA: Diagnosis not present

## 2020-03-04 DIAGNOSIS — S62624A Displaced fracture of medial phalanx of right ring finger, initial encounter for closed fracture: Secondary | ICD-10-CM | POA: Diagnosis not present

## 2020-03-04 DIAGNOSIS — M25522 Pain in left elbow: Secondary | ICD-10-CM | POA: Diagnosis not present

## 2020-03-04 DIAGNOSIS — N898 Other specified noninflammatory disorders of vagina: Secondary | ICD-10-CM | POA: Diagnosis present

## 2020-03-04 DIAGNOSIS — M7989 Other specified soft tissue disorders: Secondary | ICD-10-CM | POA: Diagnosis not present

## 2020-03-04 LAB — POCT URINALYSIS DIPSTICK, ED / UC
Bilirubin Urine: NEGATIVE
Glucose, UA: NEGATIVE mg/dL
Leukocytes,Ua: NEGATIVE
Nitrite: NEGATIVE
Protein, ur: NEGATIVE mg/dL
Specific Gravity, Urine: 1.025 (ref 1.005–1.030)
Urobilinogen, UA: 1 mg/dL (ref 0.0–1.0)
pH: 6.5 (ref 5.0–8.0)

## 2020-03-04 LAB — POC URINE PREG, ED: Preg Test, Ur: NEGATIVE

## 2020-03-04 MED ORDER — HYDROCODONE-ACETAMINOPHEN 5-325 MG PO TABS: 1.0000 | ORAL_TABLET | Freq: Three times a day (TID) | ORAL | 0 refills | Status: DC | PRN

## 2020-03-04 NOTE — Progress Notes (Signed)
Orthopedic Tech Progress Note Patient Details:  Tammy Hale 02-26-1985 [id number] Applied finger splint on patient  Patient ID: Chenika Nevils, female   DOB: April 22, 1985, 35 y.o.   MRN: [id number]   Donald Pore 03/04/2020, 3:59 PM

## 2020-03-04 NOTE — ED Provider Notes (Signed)
[facility]    CSN: [id number] Arrival date & time: 03/04/20  1356      History   Chief Complaint Chief Complaint  Patient presents with  . Hand Injury    Occurred Sunday  . Arm Injury    Occurred Sunday  . SEXUALLY TRANSMITTED DISEASE    X 1 week    HPI Tammy Hale is a 35 y.o. female.   Here today with right ring finger pain, swelling, bruising, tingling and decreased mobility after a fall on the ice 2 days ago. Also fell onto left elbow which has been sore as well. Denies swelling down extremities beyond point of injuries, numbness, severe discoloration, coolness. Pain not under good control with OTC pain relievers. Did not hit head or injure elsewhere during falls. She also complains of about a week of white vaginal discharge. Denies itching, pain, odor, pelvic pain, N/V, fevers. She has irregular cycles, no recent cycle and not on contraception. Not trying anything OTC for sxs.      Past Medical History:  Diagnosis Date  . Lupus [facility])     Patient Active Problem List   Diagnosis Date Noted  . Moderate cervical atypia 08/24/2017    Past Surgical History:  Procedure Laterality Date  . HERNIA REPAIR      OB History   No obstetric history on file.      Home Medications    Prior to Admission medications   Medication Sig Start Date End Date Taking? Authorizing Provider  HYDROcodone-acetaminophen (NORCO/VICODIN) 5-325 MG tablet Take 1 tablet by mouth 3 (three) times daily as needed for moderate pain. 03/04/20  Yes Particia Nearing, PA-C    Family History Family History  Family history unknown: Yes    Social History Social History   Tobacco Use  . Smoking status: Current Every Day Smoker    Packs/day: 0.50    Types: Cigarettes  . Smokeless tobacco: Never Used  Vaping Use  . Vaping Use: Never used  Substance Use Topics  . Alcohol use: No  . Drug use: Yes    Types: Marijuana     Allergies   Penicillins   Review of  Systems Review of Systems PER HPI    Physical Exam Triage Vital Signs ED Triage Vitals  Enc Vitals Group     BP 03/04/20 1420 109/70     Pulse Rate 03/04/20 1420 82     Resp 03/04/20 1420 18     Temp 03/04/20 1420 98 F (36.7 C)     Temp Source 03/04/20 1420 Oral     SpO2 03/04/20 1420 100 %     Weight --      Height --      Head Circumference --      Peak Flow --      Pain Score 03/04/20 1423 8     Pain Loc --      Pain Edu? --      Excl. in GC? --    No data found.  Updated Vital Signs BP 109/70 (BP Location: Left Arm)   Pulse 82   Temp 98 F (36.7 C) (Oral)   Resp 18   SpO2 100%   Visual Acuity Right Eye Distance:   Left Eye Distance:   Bilateral Distance:    Right Eye Near:   Left Eye Near:    Bilateral Near:     Physical Exam Vitals and nursing note reviewed.  Constitutional:      Appearance: Normal  appearance. She is not ill-appearing.  HENT:     Head: Atraumatic.  Eyes:     Extraocular Movements: Extraocular movements intact.     Conjunctiva/sclera: Conjunctivae normal.  Cardiovascular:     Rate and Rhythm: Normal rate and regular rhythm.     Heart sounds: Normal heart sounds.  Pulmonary:     Effort: Pulmonary effort is normal.     Breath sounds: Normal breath sounds.  Genitourinary:    Comments: GU exam deferred, self swab performed Musculoskeletal:        General: Tenderness (significant tto right ring finger from DIP to PIP, bruising, swelling in area) and signs of injury present.     Cervical back: Normal range of motion and neck supple.     Comments: Unable to bend right ring finger Left elbow ttp but no deformity or crepitus, good ROM  Skin:    General: Skin is warm and dry.  Neurological:     Mental Status: She is alert and oriented to person, place, and time.     Comments: Neurovascularly intact b/l UEs  Psychiatric:        Mood and Affect: Mood normal.        Thought Content: Thought content normal.        Judgment: Judgment  normal.      UC Treatments / Results  Labs (all labs ordered are listed, but only abnormal results are displayed) Labs Reviewed  POCT URINALYSIS DIPSTICK, ED / UC - Abnormal; Notable for the following components:      Result Value   Ketones, ur TRACE (*)    Hgb urine dipstick SMALL (*)    All other components within normal limits  POC URINE PREG, ED  CERVICOVAGINAL ANCILLARY ONLY    EKG   Radiology DG Elbow Complete Left  Result Date: 03/04/2020 CLINICAL DATA:  Left elbow pain and swelling after fall EXAM: LEFT ELBOW - COMPLETE 3+ VIEW COMPARISON:  None. FINDINGS: There is no evidence of fracture, dislocation, or joint effusion. There is no evidence of arthropathy or other focal bone abnormality. Soft tissues are unremarkable. IMPRESSION: Negative. Electronically Signed   By: Jasmine Pang M.D.   On: 03/04/2020 15:24   DG Finger Ring Right  Result Date: 03/04/2020 CLINICAL DATA:  Pain and swelling EXAM: RIGHT RING FINGER 2+V COMPARISON:  None. FINDINGS: Acute fracture involving the mid to distal aspect of fourth middle phalanx with mild displacement. No subluxation. IMPRESSION: Acute mildly displaced fourth middle phalanx fracture. Electronically Signed   By: Jasmine Pang M.D.   On: 03/04/2020 15:23    Procedures Procedures (including critical care time)  Medications Ordered in UC Medications - No data to display  Initial Impression / Assessment and Plan / UC Course  I have reviewed the triage vital signs and the nursing notes.  Pertinent labs & imaging results that were available during my care of the patient were reviewed by me and considered in my medical decision making (see chart for details).     X-ray left elbow negative, right ring finger x-ray showing mildly displaced middle phalynx fracture. Splint placed by Orthopedic tech, close orthopedic f/u recommended. RICE reviewed. Aptima swab pending, u/a and urine preg neg. Treat based on swab results.   Final  Clinical Impressions(s) / UC Diagnoses   Final diagnoses:  Closed displaced fracture of middle phalanx of right ring finger, initial encounter  Left elbow pain  Vaginal discharge   Discharge Instructions   None    ED Prescriptions  Medication Sig Dispense Auth. Provider   HYDROcodone-acetaminophen (NORCO/VICODIN) 5-325 MG tablet Take 1 tablet by mouth 3 (three) times daily as needed for moderate pain. 15 tablet Particia Nearing, [place]     I have reviewed the PDMP during this encounter.   Particia Nearing, [place] 03/04/20 [phone]

## 2020-03-04 NOTE — ED Notes (Signed)
Called Sade, ortho tech for splint placement 2/2 7 1/4" static splint not long enough to immobilize entire fx site.

## 2020-03-04 NOTE — ED Triage Notes (Signed)
Patient fell on the ice Sunday night and tried to catch herself, injuring her right hand and left elbow. Pt states the pain shoot up from her left elbow to her left upper arm. Pt would also like an std check due to symptoms for about a week. Pt states she has had a white vaginal discharge as well as itching. Pt is aox4 and ambulatory.

## 2020-03-04 NOTE — ED Notes (Signed)
Ortho called from the main hospital to bring over the appropriate sized finger splint. Ortho placed the splint while patient was in the Urgent Care.

## 2020-03-05 ENCOUNTER — Telehealth (HOSPITAL_COMMUNITY): Payer: Self-pay | Admitting: Emergency Medicine

## 2020-03-05 LAB — CERVICOVAGINAL ANCILLARY ONLY
Bacterial Vaginitis (gardnerella): POSITIVE — AB
Candida Glabrata: NEGATIVE
Candida Vaginitis: NEGATIVE
Chlamydia: NEGATIVE
Comment: NEGATIVE
Comment: NEGATIVE
Comment: NEGATIVE
Comment: NEGATIVE
Comment: NEGATIVE
Comment: NORMAL
Neisseria Gonorrhea: NEGATIVE
Trichomonas: NEGATIVE

## 2020-03-05 MED ORDER — METRONIDAZOLE 500 MG PO TABS: 500.0000 mg | ORAL_TABLET | Freq: Two times a day (BID) | ORAL | 0 refills | Status: DC

## 2020-03-17 DIAGNOSIS — S62629A Displaced fracture of medial phalanx of unspecified finger, initial encounter for closed fracture: Secondary | ICD-10-CM | POA: Insufficient documentation

## 2020-03-17 DIAGNOSIS — M79641 Pain in right hand: Secondary | ICD-10-CM | POA: Insufficient documentation

## 2020-05-02 ENCOUNTER — Ambulatory Visit (HOSPITAL_COMMUNITY)
Admission: EM | Admit: 2020-05-02 | Discharge: 2020-05-02 | Disposition: A | Discharge status: Home / Self Care | Payer: Medicaid Other | Attending: Family Medicine | Admitting: Family Medicine

## 2020-05-02 ENCOUNTER — Encounter (HOSPITAL_COMMUNITY): Payer: Self-pay | Admitting: *Deleted

## 2020-05-02 ENCOUNTER — Other Ambulatory Visit: Payer: Self-pay

## 2020-05-02 DIAGNOSIS — Z20822 Contact with and (suspected) exposure to covid-19: Secondary | ICD-10-CM | POA: Diagnosis not present

## 2020-05-02 DIAGNOSIS — Z79899 Other long term (current) drug therapy: Secondary | ICD-10-CM | POA: Insufficient documentation

## 2020-05-02 DIAGNOSIS — B009 Herpesviral infection, unspecified: Secondary | ICD-10-CM | POA: Diagnosis present

## 2020-05-02 DIAGNOSIS — Z113 Encounter for screening for infections with a predominantly sexual mode of transmission: Secondary | ICD-10-CM

## 2020-05-02 DIAGNOSIS — J302 Other seasonal allergic rhinitis: Secondary | ICD-10-CM | POA: Insufficient documentation

## 2020-05-02 DIAGNOSIS — M329 Systemic lupus erythematosus, unspecified: Secondary | ICD-10-CM | POA: Insufficient documentation

## 2020-05-02 DIAGNOSIS — F17210 Nicotine dependence, cigarettes, uncomplicated: Secondary | ICD-10-CM | POA: Diagnosis not present

## 2020-05-02 DIAGNOSIS — J069 Acute upper respiratory infection, unspecified: Secondary | ICD-10-CM | POA: Diagnosis present

## 2020-05-02 DIAGNOSIS — J3089 Other allergic rhinitis: Secondary | ICD-10-CM

## 2020-05-02 MED ORDER — FLUTICASONE PROPIONATE 50 MCG/ACT NA SUSP
1.0000 | Freq: Two times a day (BID) | NASAL | 2 refills | Status: DC
Start: 2020-05-02 — End: 2021-07-16

## 2020-05-02 MED ORDER — VALACYCLOVIR HCL 1 G PO TABS
1000.0000 mg | ORAL_TABLET | Freq: Two times a day (BID) | ORAL | 2 refills | Status: DC
Start: 2020-05-02 — End: 2020-12-04

## 2020-05-02 MED ORDER — CETIRIZINE HCL 10 MG PO TABS
10.0000 mg | ORAL_TABLET | Freq: Every day | ORAL | 1 refills | Status: DC
Start: 2020-05-02 — End: 2021-07-16

## 2020-05-02 NOTE — ED Triage Notes (Signed)
Pt reports Sx's started Monday  Sorethroat,cough, generalized body aches and runny nose. Pt also wants a STD check.

## 2020-05-02 NOTE — ED Provider Notes (Signed)
[facility]    CSN: [id number] Arrival date & time: 05/02/20  1416      History   Chief Complaint Chief Complaint  Patient presents with  . Sore Throat  . Cough  . Nasal Congestion  . Generalized Body Aches  . SEXUALLY TRANSMITTED DISEASE    HPI Tammy Hale is a 35 y.o. female.   Patient presenting today with 5-day history of sore throat, cough, congestion, sinus pressure.  She denies chest pain, shortness of breath, wheezing, abdominal pain, nausea vomiting diarrhea.  Taking some Mucinex and TheraFlu with minimal relief.  Multiple sick contacts recently and travel to [place] where she feels like she had some environmental allergies starting up.  No known past history of seasonal allergies.  She is also requesting STD screening.  No known exposures or new symptoms.     Past Medical History:  Diagnosis Date  . Lupus [facility] [address])     Patient Active Problem List   Diagnosis Date Noted  . Moderate cervical atypia 08/24/2017    Past Surgical History:  Procedure Laterality Date  . HERNIA REPAIR      OB History   No obstetric history on file.      Home Medications    Prior to Admission medications   Medication Sig Start Date End Date Taking? Authorizing Provider  cetirizine (ZYRTEC ALLERGY) 10 MG tablet Take 1 tablet (10 mg total) by mouth daily. 05/02/20  Yes Particia Nearing, PA-C  fluticasone [facility]) 50 MCG/ACT nasal spray Place 1 spray into both nostrils in the morning and at bedtime. 05/02/20  Yes Particia Nearing, PA-C  valACYclovir (VALTREX) 1000 MG tablet Take 1 tablet (1,000 mg total) by mouth 2 (two) times daily. 05/02/20  Yes Particia Nearing, PA-C  HYDROcodone-acetaminophen (NORCO/VICODIN) 5-325 MG tablet Take 1 tablet by mouth 3 (three) times daily as needed for moderate pain. 03/04/20   Particia Nearing, PA-C  metroNIDAZOLE (FLAGYL) 500 MG tablet Take 1 tablet (500 mg total) by mouth 2 (two) times daily. 03/05/20    Lamptey, Britta Mccreedy, MD    Family History Family History  Family history unknown: Yes    Social History Social History   Tobacco Use  . Smoking status: Current Every Day Smoker    Packs/day: 0.50    Types: Cigarettes  . Smokeless tobacco: Never Used  Vaping Use  . Vaping Use: Never used  Substance Use Topics  . Alcohol use: No  . Drug use: Yes    Types: Marijuana     Allergies   Penicillins   Review of Systems Review of Systems Per HPI  Physical Exam Triage Vital Signs ED Triage Vitals  Enc Vitals Group     BP 05/02/20 1504 127/77     Pulse Rate 05/02/20 1504 71     Resp 05/02/20 1504 18     Temp 05/02/20 1504 99.7 F (37.6 C)     Temp Source 05/02/20 1504 Oral     SpO2 05/02/20 1504 97 %     Weight --      Height --      Head Circumference --      Peak Flow --      Pain Score 05/02/20 1501 6     Pain Loc --      Pain Edu? --      Excl. in GC? --    No data found.  Updated Vital Signs BP 127/77 (BP Location: Right Arm)   Pulse 71  Temp 99.7 F (37.6 C) (Oral)   Resp 18   LMP 04/18/2020   SpO2 97%   Visual Acuity Right Eye Distance:   Left Eye Distance:   Bilateral Distance:    Right Eye Near:   Left Eye Near:    Bilateral Near:     Physical Exam Vitals and nursing note reviewed.  Constitutional:      Appearance: Normal appearance. She is not ill-appearing.  HENT:     Head: Atraumatic.     Nose: Rhinorrhea present.     Comments: Bilateral nasal turbinates boggy, erythematous    Mouth/Throat:     Mouth: Mucous membranes are moist.     Pharynx: Posterior oropharyngeal erythema present. No oropharyngeal exudate.  Eyes:     Extraocular Movements: Extraocular movements intact.     Conjunctiva/sclera: Conjunctivae normal.  Cardiovascular:     Rate and Rhythm: Normal rate and regular rhythm.     Heart sounds: Normal heart sounds.  Pulmonary:     Effort: Pulmonary effort is normal.     Breath sounds: Normal breath sounds.  Abdominal:      General: Bowel sounds are normal. There is no distension.     Palpations: Abdomen is soft.     Tenderness: There is no abdominal tenderness. There is no right CVA tenderness, left CVA tenderness or guarding.  Musculoskeletal:        General: Normal range of motion.     Cervical back: Normal range of motion and neck supple.  Skin:    General: Skin is warm and dry.  Neurological:     Mental Status: She is alert and oriented to person, place, and time.  Psychiatric:        Mood and Affect: Mood normal.        Thought Content: Thought content normal.        Judgment: Judgment normal.      UC Treatments / Results  Labs (all labs ordered are listed, but only abnormal results are displayed) Labs Reviewed  SARS CORONAVIRUS 2 (TAT 6-24 HRS)  CERVICOVAGINAL ANCILLARY ONLY    EKG   Radiology No results found.  Procedures Procedures (including critical care time)  Medications Ordered in UC Medications - No data to display  Initial Impression / Assessment and Plan / UC Course  I have reviewed the triage vital signs and the nursing notes.  Pertinent labs & imaging results that were available during my care of the patient were reviewed by me and considered in my medical decision making (see chart for details).     Suspect combination of allergies and viral URI causing symptoms.  Will start good allergy regimen with Zyrtec and Flonase as well as DayQuil, NyQuil, Sudafed as needed.  Covid test pending, work note given.  Return for acutely worsening symptoms.  STI screening pending, abstinence reviewed until results return.  Safe sexual practices reviewed.  Final Clinical Impressions(s) / UC Diagnoses   Final diagnoses:  Viral URI with cough  Seasonal allergic rhinitis due to other allergic trigger  HSV (herpes simplex virus) infection  Routine screening for STI (sexually transmitted infection)   Discharge Instructions   None    ED Prescriptions    Medication Sig  Dispense Auth. Provider   cetirizine (ZYRTEC ALLERGY) 10 MG tablet Take 1 tablet (10 mg total) by mouth daily. 30 tablet Particia Nearing, PA-C   fluticasone [facility]) 50 MCG/ACT nasal spray Place 1 spray into both nostrils in the morning and at bedtime. 16  g Particia Nearing, PA-C   valACYclovir (VALTREX) 1000 MG tablet Take 1 tablet (1,000 mg total) by mouth 2 (two) times daily. 14 tablet Particia Nearing, [place]     PDMP not reviewed this encounter.   Particia Nearing, [place] 05/02/20 1555

## 2020-05-03 LAB — SARS CORONAVIRUS 2 (TAT 6-24 HRS): SARS Coronavirus 2: NEGATIVE

## 2020-05-05 ENCOUNTER — Telehealth (HOSPITAL_COMMUNITY): Payer: Self-pay | Admitting: Emergency Medicine

## 2020-05-05 LAB — CERVICOVAGINAL ANCILLARY ONLY
Bacterial Vaginitis (gardnerella): POSITIVE — AB
Candida Glabrata: NEGATIVE
Candida Vaginitis: NEGATIVE
Chlamydia: NEGATIVE
Comment: NEGATIVE
Comment: NEGATIVE
Comment: NEGATIVE
Comment: NEGATIVE
Comment: NEGATIVE
Comment: NORMAL
Neisseria Gonorrhea: NEGATIVE
Trichomonas: NEGATIVE

## 2020-05-05 MED ORDER — METRONIDAZOLE 500 MG PO TABS: 500.0000 mg | ORAL_TABLET | Freq: Two times a day (BID) | ORAL | 0 refills | Status: DC

## 2020-08-28 ENCOUNTER — Other Ambulatory Visit: Payer: Self-pay

## 2020-08-28 ENCOUNTER — Ambulatory Visit (HOSPITAL_COMMUNITY)
Admission: EM | Admit: 2020-08-28 | Discharge: 2020-08-28 | Disposition: A | Discharge status: Home / Self Care | Payer: Medicaid Other | Attending: Family Medicine | Admitting: Family Medicine

## 2020-08-28 ENCOUNTER — Encounter (HOSPITAL_COMMUNITY): Payer: Self-pay

## 2020-08-28 DIAGNOSIS — R609 Edema, unspecified: Secondary | ICD-10-CM | POA: Diagnosis not present

## 2020-08-28 DIAGNOSIS — R202 Paresthesia of skin: Secondary | ICD-10-CM

## 2020-08-28 DIAGNOSIS — R200 Anesthesia of skin: Secondary | ICD-10-CM | POA: Diagnosis not present

## 2020-08-28 MED ORDER — PREDNISONE 20 MG PO TABS: 40.0000 mg | ORAL_TABLET | Freq: Every day | ORAL | 0 refills | Status: DC

## 2020-08-28 MED ORDER — GABAPENTIN 100 MG PO CAPS: 100.0000 mg | ORAL_CAPSULE | Freq: Three times a day (TID) | ORAL | 0 refills | Status: DC | PRN

## 2020-08-28 NOTE — Discharge Instructions (Addendum)
Can try reaching out to [facility], [facility], [facility] to see about getting follow up care arranged

## 2020-08-28 NOTE — ED Triage Notes (Addendum)
Pt in with c/o bilateral leg pain and swelling x 3 days  Pt states she feels numbness from the bottom of her knees to the tip of her toes  Pt states that she has been told before that it was lupus but went for another evaluation and was told it was not lupus

## 2020-08-28 NOTE — ED Provider Notes (Signed)
[facility]    CSN: [id number] Arrival date & time: 08/28/20  1148      History   Chief Complaint Chief Complaint  Patient presents with   Leg Swelling    HPI Tammy Hale is a 35 y.o. female.   Patient is in today with acute on chronic bilateral lower leg swelling continues.  Endorses having this issue off and on for years now but that it's been worse  the last 3 days.  States that 1 time she was told she had lupus but that she was worked up by a rheumatologist at [facility] 2 years ago and was told she did not have lupus any associated neurologist for further evaluation.  She has been actively looking for a primary care provider to make this referral but has not been able to find one yet.  Has taken gabapentin and steroids in the past for the symptoms with good temporary relief.  No new injury, history of back issues, leg weakness, bowel or bladder changes, skin changes.  Not currently trying anything over-the-counter for symptoms.  Missed work yesterday due to the pain. .   Past Medical History:  Diagnosis Date   Lupus [facility])     Patient Active Problem List   Diagnosis Date Noted   Moderate cervical atypia 08/24/2017    Past Surgical History:  Procedure Laterality Date   HERNIA REPAIR      OB History   No obstetric history on file.      Home Medications    Prior to Admission medications   Medication Sig Start Date End Date Taking? Authorizing Provider  gabapentin (NEURONTIN) 100 MG capsule Take 1 capsule (100 mg total) by mouth 3 (three) times daily as needed. 08/28/20  Yes Particia Nearing, PA-C  predniSONE (DELTASONE) 20 MG tablet Take 2 tablets (40 mg total) by mouth daily with breakfast. 08/28/20  Yes Particia Nearing, PA-C  cetirizine (ZYRTEC ALLERGY) 10 MG tablet Take 1 tablet (10 mg total) by mouth daily. 05/02/20   Particia Nearing, PA-C  fluticasone [facility]) 50 MCG/ACT nasal spray Place 1 spray into both nostrils in the  morning and at bedtime. 05/02/20   Particia Nearing, PA-C  HYDROcodone-acetaminophen (NORCO/VICODIN) 5-325 MG tablet Take 1 tablet by mouth 3 (three) times daily as needed for moderate pain. 03/04/20   Particia Nearing, PA-C  metroNIDAZOLE (FLAGYL) 500 MG tablet Take 1 tablet (500 mg total) by mouth 2 (two) times daily. 05/05/20   Lamptey, Britta Mccreedy, MD  valACYclovir (VALTREX) 1000 MG tablet Take 1 tablet (1,000 mg total) by mouth 2 (two) times daily. 05/02/20   Particia Nearing, PA-C    Family History Family History  Problem Relation Age of Onset   Healthy Mother     Social History Social History   Tobacco Use   Smoking status: Every Day    Packs/day: 0.50    Types: Cigarettes   Smokeless tobacco: Never  Vaping Use   Vaping Use: Never used  Substance Use Topics   Alcohol use: No   Drug use: Yes    Types: Marijuana     Allergies   Penicillins   Review of Systems Review of Systems Per HPI  Physical Exam Triage Vital Signs ED Triage Vitals  Enc Vitals Group     BP 08/28/20 1203 126/77     Pulse Rate 08/28/20 1203 81     Resp 08/28/20 1203 16     Temp 08/28/20 1203 98.9 F (  37.2 C)     Temp Source 08/28/20 1203 Oral     SpO2 08/28/20 1203 99 %     Weight --      Height --      Head Circumference --      Peak Flow --      Pain Score 08/28/20 1202 8     Pain Loc --      Pain Edu? --      Excl. in GC? --    No data found.  Updated Vital Signs BP 126/77 (BP Location: Right Arm)   Pulse 81   Temp 98.9 F (37.2 C) (Oral)   Resp 16   SpO2 99%   Visual Acuity Right Eye Distance:   Left Eye Distance:   Bilateral Distance:    Right Eye Near:   Left Eye Near:    Bilateral Near:     Physical Exam Vitals and nursing note reviewed.  Constitutional:      Appearance: Normal appearance. She is not ill-appearing.  HENT:     Head: Atraumatic.  Eyes:     Extraocular Movements: Extraocular movements intact.     Conjunctiva/sclera: Conjunctivae  normal.  Cardiovascular:     Rate and Rhythm: Normal rate and regular rhythm.     Heart sounds: Normal heart sounds.  Pulmonary:     Effort: Pulmonary effort is normal.     Breath sounds: Normal breath sounds.  Musculoskeletal:        General: Swelling present. No deformity or signs of injury. Normal range of motion.     Cervical back: Normal range of motion and neck supple.     Comments: 1+ lower leg edema bilaterally, symmetric Negative Homans' sign, squeeze test bilateral lower extremities  Skin:    General: Skin is warm and dry.     Findings: No erythema.  Neurological:     Mental Status: She is alert and oriented to person, place, and time.     Comments: Bilateral lower extremities neurovascularly intact  Psychiatric:        Mood and Affect: Mood normal.        Thought Content: Thought content normal.        Judgment: Judgment normal.   UC Treatments / Results  Labs (all labs ordered are listed, but only abnormal results are displayed) Labs Reviewed - No data to display  EKG   Radiology No results found.  Procedures Procedures (including critical care time)  Medications Ordered in UC Medications - No data to display  Initial Impression / Assessment and Plan / UC Course  I have reviewed the triage vital signs and the nursing notes.  Pertinent labs & imaging results that were available during my care of the patient were reviewed by me and considered in my medical decision making (see chart for details).     Acute on chronic issue, will treat with prednisone, gabapentin for reduction of current flare but is aware that she needs to seek further work-up through primary care and possibly a specialist given ongoing nature and severity of symptoms.  Compression stockings, leg elevation reviewed in the meantime as well.  ED for acutely worsening symptoms.  Work note given as she missed yesterday.  Final Clinical Impressions(s) / UC Diagnoses   Final diagnoses:   Peripheral edema  Numbness and tingling     Discharge Instructions      Can try reaching out to [facility], [facility], [facility] to see about getting  follow up care arranged     ED Prescriptions     Medication Sig Dispense Auth. Provider   predniSONE (DELTASONE) 20 MG tablet Take 2 tablets (40 mg total) by mouth daily with breakfast. 10 tablet Particia Nearing, PA-C   gabapentin (NEURONTIN) 100 MG capsule Take 1 capsule (100 mg total) by mouth 3 (three) times daily as needed. 30 capsule Particia Nearing, [place]      PDMP not reviewed this encounter.   Particia Nearing, [place] 08/28/20 1539

## 2020-09-11 ENCOUNTER — Encounter (HOSPITAL_COMMUNITY): Payer: Self-pay

## 2020-09-11 ENCOUNTER — Other Ambulatory Visit: Payer: Self-pay

## 2020-09-11 ENCOUNTER — Ambulatory Visit (HOSPITAL_COMMUNITY)
Admission: EM | Admit: 2020-09-11 | Discharge: 2020-09-11 | Disposition: A | Discharge status: Home / Self Care | Payer: Medicaid Other | Attending: Internal Medicine | Admitting: Internal Medicine

## 2020-09-11 DIAGNOSIS — M549 Dorsalgia, unspecified: Secondary | ICD-10-CM

## 2020-09-11 MED ORDER — TIZANIDINE HCL 4 MG PO TABS: 4.0000 mg | ORAL_TABLET | Freq: Every evening | ORAL | 0 refills | Status: DC | PRN

## 2020-09-11 MED ORDER — IBUPROFEN 600 MG PO TABS: 600.0000 mg | ORAL_TABLET | Freq: Four times a day (QID) | ORAL | 0 refills | Status: DC | PRN

## 2020-09-11 MED ORDER — KETOROLAC TROMETHAMINE 30 MG/ML IJ SOLN
30.0000 mg | Freq: Once | INTRAMUSCULAR | Status: AC
  Administered 2020-09-11: 30 mg via INTRAMUSCULAR

## 2020-09-11 MED ORDER — KETOROLAC TROMETHAMINE 30 MG/ML IJ SOLN
INTRAMUSCULAR | Status: AC
  Filled 2020-09-11: qty 1

## 2020-09-11 NOTE — Discharge Instructions (Addendum)
Gentle range of motion exercises Use heating pad on a 15 minutes on-15 minutes off cycle. If symptoms worsen please return to urgent care to be reevaluated There is no indication for x-ray at this time

## 2020-09-11 NOTE — ED Provider Notes (Signed)
[facility]    CSN: [id number] Arrival date & time: 09/11/20  1034      History   Chief Complaint Chief Complaint  Patient presents with   Shortness of Breath    HPI Tammy Hale is a 35 y.o. female comes to the urgent care with right upper back pain of 1 day duration.  Patient symptoms started yesterday and has been persistent.  Pain is of moderate severity.  It is aggravated by movement and palpation.  She has not tried any over-the-counter medications for the pain.  Patient denies any radiation of the pain.  Pain is aggravated by taking a deep breath.  She denies any calf pain or tenderness.  No recent long distance travel.  No family history of DVT/PE.  No oral contraceptive use.   HPI  Past Medical History:  Diagnosis Date   Lupus [facility])     Patient Active Problem List   Diagnosis Date Noted   Moderate cervical atypia 08/24/2017    Past Surgical History:  Procedure Laterality Date   HERNIA REPAIR      OB History   No obstetric history on file.      Home Medications    Prior to Admission medications   Medication Sig Start Date End Date Taking? Authorizing Provider  ibuprofen (ADVIL) 600 MG tablet Take 1 tablet (600 mg total) by mouth every 6 (six) hours as needed. 09/11/20  Yes Meryem Haertel, Britta Mccreedy, MD  tiZANidine (ZANAFLEX) 4 MG tablet Take 1 tablet (4 mg total) by mouth at bedtime as needed for muscle spasms. 09/11/20  Yes Cynitha Berte, Britta Mccreedy, MD  cetirizine (ZYRTEC ALLERGY) 10 MG tablet Take 1 tablet (10 mg total) by mouth daily. 05/02/20   Particia Nearing, PA-C  fluticasone [facility]) 50 MCG/ACT nasal spray Place 1 spray into both nostrils in the morning and at bedtime. 05/02/20   Particia Nearing, PA-C  gabapentin (NEURONTIN) 100 MG capsule Take 1 capsule (100 mg total) by mouth 3 (three) times daily as needed. 08/28/20   Particia Nearing, PA-C  HYDROcodone-acetaminophen (NORCO/VICODIN) 5-325 MG tablet Take 1 tablet by mouth 3 (three) times  daily as needed for moderate pain. 03/04/20   Particia Nearing, PA-C  metroNIDAZOLE (FLAGYL) 500 MG tablet Take 1 tablet (500 mg total) by mouth 2 (two) times daily. 05/05/20   Merrilee Jansky, MD  predniSONE (DELTASONE) 20 MG tablet Take 2 tablets (40 mg total) by mouth daily with breakfast. 08/28/20   Particia Nearing, PA-C  valACYclovir (VALTREX) 1000 MG tablet Take 1 tablet (1,000 mg total) by mouth 2 (two) times daily. 05/02/20   Particia Nearing, PA-C    Family History Family History  Problem Relation Age of Onset   Healthy Mother     Social History Social History   Tobacco Use   Smoking status: Every Day    Packs/day: 0.50    Types: Cigarettes   Smokeless tobacco: Never  Vaping Use   Vaping Use: Never used  Substance Use Topics   Alcohol use: No   Drug use: Not Currently    Types: Marijuana    Comment: former user     Allergies   Penicillins   Review of Systems Review of Systems  Constitutional: Negative.   Gastrointestinal: Negative.   Musculoskeletal:  Positive for back pain and myalgias. Negative for arthralgias.  Neurological: Negative.   Psychiatric/Behavioral: Negative.      Physical Exam Triage Vital Signs ED Triage Vitals  Enc Vitals  Group     BP 09/11/20 1143 114/63     Pulse Rate 09/11/20 1140 66     Resp --      Temp 09/11/20 1140 98.4 F (36.9 C)     Temp Source 09/11/20 1140 Oral     SpO2 09/11/20 1140 100 %     Weight --      Height --      Head Circumference --      Peak Flow --      Pain Score 09/11/20 1141 7     Pain Loc --      Pain Edu? --      Excl. in GC? --    No data found.  Updated Vital Signs BP 114/63 (BP Location: Right Arm)   Pulse 66   Temp 98.4 F (36.9 C) (Oral)   LMP 09/11/2020 (Exact Date)   SpO2 100%   Visual Acuity Right Eye Distance:   Left Eye Distance:   Bilateral Distance:    Right Eye Near:   Left Eye Near:    Bilateral Near:     Physical Exam Vitals and nursing note  reviewed.  Constitutional:      General: She is in acute distress.     Appearance: She is well-developed. She is not ill-appearing.  Pulmonary:     Breath sounds: No decreased breath sounds, wheezing, rhonchi or rales.  Abdominal:     General: Bowel sounds are normal.     Palpations: Abdomen is soft.  Musculoskeletal:     Comments: Tenderness over the latissimus dorsi and the scapula.  Full range of motion around the right shoulder.  Neurological:     Mental Status: She is alert.     UC Treatments / Results  Labs (all labs ordered are listed, but only abnormal results are displayed) Labs Reviewed - No data to display  EKG   Radiology No results found.  Procedures Procedures (including critical care time)  Medications Ordered in UC Medications  ketorolac (TORADOL) 30 MG/ML injection 30 mg (30 mg Intramuscular Given 09/11/20 1218)    Initial Impression / Assessment and Plan / UC Course  I have reviewed the triage vital signs and the nursing notes.  Pertinent labs & imaging results that were available during my care of the patient were reviewed by me and considered in my medical decision making (see chart for details).     1.  Musculoskeletal pain involving right upper back.: Gentle range of motion exercises Heating pad use on a 15-minute on-15 minutes off cycle Take medications as prescribed If symptoms worsen please return to urgent care to be reevaluated. Final Clinical Impressions(s) / UC Diagnoses   Final diagnoses:  Musculoskeletal back pain     Discharge Instructions      Gentle range of motion exercises Use heating pad on a 15 minutes on-15 minutes off cycle. If symptoms worsen please return to urgent care to be reevaluated There is no indication for x-ray at this time    ED Prescriptions     Medication Sig Dispense Auth. Provider   ibuprofen (ADVIL) 600 MG tablet Take 1 tablet (600 mg total) by mouth every 6 (six) hours as needed. 30 tablet  Jaymason Ledesma, Britta Mccreedy, MD   tiZANidine (ZANAFLEX) 4 MG tablet Take 1 tablet (4 mg total) by mouth at bedtime as needed for muscle spasms. 10 tablet Keyvon Herter, Britta Mccreedy, MD      PDMP not reviewed this encounter.   Kroy Sprung, Britta Mccreedy, MD  09/11/20 1243  

## 2020-09-11 NOTE — ED Triage Notes (Signed)
Pt presents with c/o SOB.   States when she breathes in her lungs hurt. States it started yesterday while she was at work. C/o having trouble talking.

## 2020-12-04 ENCOUNTER — Ambulatory Visit (HOSPITAL_COMMUNITY)
Admission: RE | Admit: 2020-12-04 | Discharge: 2020-12-04 | Disposition: A | Discharge status: Home / Self Care | Payer: Medicaid Other | Source: Ambulatory Visit | Attending: Emergency Medicine | Admitting: Emergency Medicine

## 2020-12-04 ENCOUNTER — Other Ambulatory Visit: Payer: Self-pay

## 2020-12-04 ENCOUNTER — Encounter (HOSPITAL_COMMUNITY): Payer: Self-pay

## 2020-12-04 VITALS — BP 102/63 | HR 82 | Temp 98.6°F | Resp 18

## 2020-12-04 DIAGNOSIS — R300 Dysuria: Secondary | ICD-10-CM | POA: Diagnosis not present

## 2020-12-04 DIAGNOSIS — R102 Pelvic and perineal pain: Secondary | ICD-10-CM | POA: Diagnosis present

## 2020-12-04 LAB — POCT URINALYSIS DIPSTICK, ED / UC
Bilirubin Urine: NEGATIVE
Glucose, UA: NEGATIVE mg/dL
Ketones, ur: NEGATIVE mg/dL
Leukocytes,Ua: NEGATIVE
Nitrite: NEGATIVE
Protein, ur: NEGATIVE mg/dL
Specific Gravity, Urine: 1.02 (ref 1.005–1.030)
Urobilinogen, UA: 0.2 mg/dL (ref 0.0–1.0)
pH: 6.5 (ref 5.0–8.0)

## 2020-12-04 MED ORDER — NITROFURANTOIN MONOHYD MACRO 100 MG PO CAPS: 100.0000 mg | ORAL_CAPSULE | Freq: Two times a day (BID) | ORAL | 0 refills | Status: DC

## 2020-12-04 MED ORDER — CYCLOBENZAPRINE HCL 10 MG PO TABS: 10.0000 mg | ORAL_TABLET | Freq: Two times a day (BID) | ORAL | 0 refills | Status: DC | PRN

## 2020-12-04 NOTE — Discharge Instructions (Addendum)
Your urinalysis was negative for infection, it has been sent to lab to see if grows bacteria, if this occurs you will be notified   Based on your symptoms I will treat you for a urinary infection, if no bacteria is seen this you will be notified  Take macrobid twice a day for 5 days  The muscle relaxer you can twice a day as needed, be mindful this may make you drowsy   Labs pending 2-3 days, you will be contacted if positive for bacteria vaginosis or yeast and treatment will be sent to the pharmacy, you will have to return to the clinic if positive for gonorrhea to receive treatment   Please refrain from having sex until labs results, if positive please refrain from having sex until treatment complete and symptoms resolve   At any point if your symptoms worsen go to the nearest emergency department for evaluation

## 2020-12-04 NOTE — ED Triage Notes (Signed)
Pt is present today with abdominal pain, back pain, dysuria, and vaginal discharge. Pt sates sx started x3 days ago.

## 2020-12-04 NOTE — ED Provider Notes (Signed)
[facility]    CSN: [id number] Arrival date & time: 12/04/20  0901      History   Chief Complaint Chief Complaint  Patient presents with   Vaginitis   Abdominal Pain   Back Pain   Dysuria    HPI Tammy Hale is a 35 y.o. female.   Patient presents with generalized abdominal pain, bilateral lower back pain, dysuria, urinary frequency, urinary urgency and Tammy Hale thin discharge for 3 days.  Has not attempted treatment of symptoms denies sexual activity, last encounter 8 months ago.  Denies nausea, vomiting, diarrhea, constipation, fever, chills, hematuria, vaginal itching or irritation, new rash or lesions.  History of lupus.  Past Medical History:  Diagnosis Date   Lupus [facility])     Patient Active Problem List   Diagnosis Date Noted   Moderate cervical atypia 08/24/2017    Past Surgical History:  Procedure Laterality Date   HERNIA REPAIR      OB History   No obstetric history on file.      Home Medications    Prior to Admission medications   Medication Sig Start Date End Date Taking? Authorizing Provider  cyclobenzaprine (FLEXERIL) 10 MG tablet Take 1 tablet (10 mg total) by mouth 2 (two) times daily as needed for muscle spasms. 12/04/20  Yes Edna Rede R, NP  nitrofurantoin, macrocrystal-monohydrate, (MACROBID) 100 MG capsule Take 1 capsule (100 mg total) by mouth 2 (two) times daily. 12/04/20  Yes Lynnmarie Lovett, Elita Boone, NP  cetirizine (ZYRTEC ALLERGY) 10 MG tablet Take 1 tablet (10 mg total) by mouth daily. 05/02/20   Particia Nearing, PA-C  fluticasone [facility]) 50 MCG/ACT nasal spray Place 1 spray into both nostrils in the morning and at bedtime. 05/02/20   Particia Nearing, PA-C    Family History Family History  Problem Relation Age of Onset   Healthy Mother     Social History Social History   Tobacco Use   Smoking status: Every Day    Packs/day: 0.50    Types: Cigarettes   Smokeless tobacco: Never  Vaping Use   Vaping  Use: Never used  Substance Use Topics   Alcohol use: No   Drug use: Not Currently    Types: Marijuana    Comment: former user     Allergies   Penicillins   Review of Systems Review of Systems  Constitutional: Negative.   Respiratory: Negative.    Cardiovascular: Negative.   Gastrointestinal:  Positive for abdominal pain. Negative for abdominal distention, anal bleeding, blood in stool, constipation, diarrhea, nausea, rectal pain and vomiting.  Genitourinary:  Positive for dysuria, flank pain, frequency and vaginal discharge. Negative for decreased urine volume, difficulty urinating, dyspareunia, enuresis, genital sores, hematuria, menstrual problem, pelvic pain, urgency, vaginal bleeding and vaginal pain.  Skin: Negative.     Physical Exam Triage Vital Signs ED Triage Vitals  Enc Vitals Group     BP 12/04/20 0931 102/63     Pulse Rate 12/04/20 0931 82     Resp 12/04/20 0931 18     Temp 12/04/20 0931 98.6 F (37 C)     Temp src --      SpO2 12/04/20 0931 98 %     Weight --      Height --      Head Circumference --      Peak Flow --      Pain Score 12/04/20 0928 6     Pain Loc --      Pain  Edu? --      Excl. in GC? --    No data found.  Updated Vital Signs BP 102/63   Pulse 82   Temp 98.6 F (37 C)   Resp 18   SpO2 98%   Visual Acuity Right Eye Distance:   Left Eye Distance:   Bilateral Distance:    Right Eye Near:   Left Eye Near:    Bilateral Near:     Physical Exam Constitutional:      Appearance: She is well-developed and normal weight.  Cardiovascular:     Rate and Rhythm: Normal rate and regular rhythm.  Pulmonary:     Effort: Pulmonary effort is normal.     Breath sounds: Normal breath sounds.  Abdominal:     General: Abdomen is flat. Bowel sounds are normal.     Palpations: Abdomen is soft.     Tenderness: There is abdominal tenderness in the suprapubic area. There is right CVA tenderness and left CVA tenderness.  Genitourinary:     Comments: Deferred self collect vaginal swab Skin:    General: Skin is warm and dry.  Neurological:     General: No focal deficit present.     Mental Status: She is alert.  Psychiatric:        Mood and Affect: Mood normal.        Behavior: Behavior normal.     UC Treatments / Results  Labs (all labs ordered are listed, but only abnormal results are displayed) Labs Reviewed  POCT URINALYSIS DIPSTICK, ED / UC - Abnormal; Notable for the following components:      Result Value   Hgb urine dipstick TRACE (*)    All other components within normal limits  URINE CULTURE  CERVICOVAGINAL ANCILLARY ONLY    EKG   Radiology No results found.  Procedures Procedures (including critical care time)  Medications Ordered in UC Medications - No data to display  Initial Impression / Assessment and Plan / UC Course  I have reviewed the triage vital signs and the nursing notes.  Pertinent labs & imaging results that were available during my care of the patient were reviewed by me and considered in my medical decision making (see chart for details).  Dysuria Suprapubic abdominal pain   We will treat prophylactically for urinary tract infection based on the symptoms 1.  Urinalysis showing hemoglobin, sent for culture 2.  STI screening for bacterial vaginosis and yeast pending, will treat per protocol 3.  Patient given return precautions for worsening symptoms from nearest emergency department for evaluation 4.  Macrobid 100 mg twice daily for 5 days 5.  Flexeril 10 mg twice daily as needed Final Clinical Impressions(s) / UC Diagnoses   Final diagnoses:  Dysuria  Suprapubic abdominal pain     Discharge Instructions      Your urinalysis was negative for infection, it has been sent to lab to see if grows bacteria, if this occurs you will be notified   Based on your symptoms I will treat you for a urinary infection, if no bacteria is seen this you will be notified  Take macrobid  twice a day for 5 days  The muscle relaxer you can twice a day as needed, be mindful this may make you drowsy   Labs pending 2-3 days, you will be contacted if positive for bacteria vaginosis or yeast and treatment will be sent to the pharmacy, you will have to return to the clinic if positive for gonorrhea to  receive treatment   Please refrain from having sex until labs results, if positive please refrain from having sex until treatment complete and symptoms resolve   At any point if your symptoms worsen go to the nearest emergency department for evaluation     ED Prescriptions     Medication Sig Dispense Auth. Provider   nitrofurantoin, macrocrystal-monohydrate, (MACROBID) 100 MG capsule Take 1 capsule (100 mg total) by mouth 2 (two) times daily. 10 capsule Lu Paradise R, NP   cyclobenzaprine (FLEXERIL) 10 MG tablet Take 1 tablet (10 mg total) by mouth 2 (two) times daily as needed for muscle spasms. 20 tablet Valinda Hoar, NP      PDMP not reviewed this encounter.   Valinda Hoar, NP 12/04/20 1149

## 2020-12-05 LAB — CERVICOVAGINAL ANCILLARY ONLY
Bacterial Vaginitis (gardnerella): POSITIVE — AB
Candida Glabrata: NEGATIVE
Candida Vaginitis: NEGATIVE
Comment: NEGATIVE
Comment: NEGATIVE
Comment: NEGATIVE

## 2020-12-05 LAB — URINE CULTURE

## 2020-12-06 ENCOUNTER — Telehealth (HOSPITAL_COMMUNITY): Payer: Self-pay | Admitting: Emergency Medicine

## 2020-12-06 MED ORDER — METRONIDAZOLE 500 MG PO TABS: 500.0000 mg | ORAL_TABLET | Freq: Two times a day (BID) | ORAL | 0 refills | Status: DC

## 2021-05-30 ENCOUNTER — Other Ambulatory Visit: Payer: Self-pay

## 2021-05-30 ENCOUNTER — Ambulatory Visit (HOSPITAL_COMMUNITY)
Admission: RE | Admit: 2021-05-30 | Discharge: 2021-05-30 | Disposition: A | Discharge status: Home / Self Care | Payer: Medicaid Other | Source: Ambulatory Visit | Attending: Nurse Practitioner | Admitting: Nurse Practitioner

## 2021-05-30 ENCOUNTER — Encounter (HOSPITAL_COMMUNITY): Payer: Self-pay

## 2021-05-30 VITALS — BP 99/62 | HR 77 | Temp 98.8°F | Resp 18

## 2021-05-30 DIAGNOSIS — B009 Herpesviral infection, unspecified: Secondary | ICD-10-CM | POA: Insufficient documentation

## 2021-05-30 DIAGNOSIS — Z113 Encounter for screening for infections with a predominantly sexual mode of transmission: Secondary | ICD-10-CM | POA: Diagnosis not present

## 2021-05-30 LAB — HIV ANTIBODY (ROUTINE TESTING W REFLEX): HIV Screen 4th Generation wRfx: NONREACTIVE

## 2021-05-30 MED ORDER — VALACYCLOVIR HCL 500 MG PO TABS: 500.0000 mg | ORAL_TABLET | Freq: Two times a day (BID) | ORAL | 0 refills | Status: AC

## 2021-05-30 NOTE — ED Provider Notes (Signed)
?[place] ? ? ? ?CSN: [id number] ?Arrival date & time: 05/30/21  1554 ? ? ?  ? ?History   ?Chief Complaint ?Chief Complaint  ?Patient presents with  ? Vaginal Itching  ?  Am having bad dryness and itching. - Entered by patient  ? ? ?HPI ?Tammy Hale is a 36 y.o. female.  ? ?Patient is presenting today for vaginal herpes outbreak.  She also describes vaginal discharge and irritation that started yesterday with the outbreak started.  She denies dysuria, lower abdominal pain, back pain, nausea/vomiting, fevers.  She is sexually active and is also requesting STI screening. ? ?Last menstrual period was last week. ? ? ?Past Medical History:  ?Diagnosis Date  ? Lupus ([place])   ? ? ?Patient Active Problem List  ? Diagnosis Date Noted  ? 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 ? ?Past Surgical History:  ?Procedure Laterality Date  ? HERNIA REPAIR    ? ? ?OB History   ?No obstetric history on file. ?  ? ? ? ?Home Medications   ? ?Prior to Admission medications   ?Medication Sig Start Date End Date Taking? Authorizing Provider  ?valACYclovir (VALTREX) 500 MG tablet Take 1 tablet (500 mg total) by mouth 2 (two) times daily for 3 days. 05/30/21 06/02/21 Yes Eulogio Bear, NP  ?cetirizine (ZYRTEC ALLERGY) 10 MG tablet Take 1 tablet (10 mg total) by mouth daily. 05/02/20   Volney American, PA-C  ?cyclobenzaprine (FLEXERIL) 10 MG tablet Take 1 tablet (10 mg total) by mouth 2 (two) times daily as needed for muscle spasms. 12/04/20   Hans Eden, NP  ?fluticasone (FLONASE) 50 MCG/ACT nasal spray Place 1 spray into both nostrils in the morning and at bedtime. 05/02/20   Volney American, PA-C  ? ? ?Family History ?Family History  ?Problem Relation Age of Onset  ? Healthy Mother   ? ? ?Social History ?Social History  ? ?Tobacco Use  ? Smoking status: Every Day  ?  Packs/day: 0.50  ?  Types: Cigarettes  ? Smokeless tobacco: Never  ?Vaping Use  ? Vaping Use: Never used  ?Substance Use Topics  ? Alcohol  use: No  ? Drug use: Not Currently  ?  Types: Marijuana  ?  Comment: former user  ? ? ? ?Allergies   ?Penicillins ? ? ?Review of Systems ?Review of Systems ?Per HPI ? ?Physical Exam ?Triage Vital Signs ?ED Triage Vitals  ?Enc Vitals Group  ?   BP 05/30/21 1620 99/62  ?   Pulse Rate 05/30/21 1620 77  ?   Resp 05/30/21 1620 18  ?   Temp 05/30/21 1620 98.8 ?F (37.1 ?C)  ?   Temp src --   ?   SpO2 05/30/21 1620 94 %  ?   Weight --   ?   Height --   ?   Head Circumference --   ?   Peak Flow --   ?   Pain Score 05/30/21 1617 8  ?   Pain Loc --   ?   Pain Edu? --   ?   Excl. in [place]? --   ? ?No data found. ? ?Updated Vital Signs ?BP 99/62   Pulse 77   Temp 98.8 ?F (37.1 ?C)   Resp 18   LMP 05/23/2021   SpO2 94%  ? ?Visual Acuity ?Right Eye Distance:   ?Left Eye Distance:   ?Bilateral Distance:   ? ?Right Eye Near:   ?Left Eye Near:    ?  Bilateral Near:    ? ?Physical Exam ?Vitals and nursing note reviewed. Exam conducted with a chaperone present (Demetrice Nathrop, CMA).  ?Constitutional:   ?   General: She is not in acute distress. ?   Appearance: Normal appearance. She is not toxic-appearing.  ?Genitourinary: ?   Exam position: Lithotomy position.  ?   Pubic Area: No rash.   ?   Labia:     ?   Right: Lesion present.   ?   Vagina: No signs of injury. Vaginal discharge present. No tenderness.  ?   Cervix: Normal.  ?   Uterus: Normal.   ?   Adnexa: Right adnexa normal and left adnexa normal.  ? ? ?   Comments: Multiple small, circular, flesh colored lesions to external genitalia; white, thick, odorous vaginal discharge ?Lymphadenopathy:  ?   Lower Body: No right inguinal adenopathy.  ?Skin: ?   General: Skin is warm and dry.  ?   Coloration: Skin is not jaundiced or pale.  ?   Findings: No erythema.  ?Neurological:  ?   Mental Status: She is alert and oriented to person, place, and time.  ?   Motor: No weakness.  ?   Gait: Gait normal.  ?Psychiatric:     ?   Mood and Affect: Mood normal.     ?   Behavior: Behavior is  cooperative.  ? ? ? ?UC Treatments / Results  ?Labs ?(all labs ordered are listed, but only abnormal results are displayed) ?Labs Reviewed  ?HSV CULTURE AND TYPING  ?RPR  ?HIV ANTIBODY (ROUTINE TESTING W REFLEX)  ?CERVICOVAGINAL ANCILLARY ONLY  ? ? ?EKG ? ? ?Radiology ?No results found. ? ?Procedures ?Procedures (including critical care time) ? ?Medications Ordered in UC ?Medications - No data to display ? ?Initial Impression / Assessment and Plan / UC Course  ?I have reviewed the triage vital signs and the nursing notes. ? ?Pertinent labs & imaging results that were available during my care of the patient were reviewed by me and considered in my medical decision making (see chart for details). ? ?  ?Treat for HSV outbreak with Valtrex 500 mg twice daily for 3 days.  Check vaginal swab for gonorrhea, chlamydia, trichomonas, yeast vaginitis, bacterial vaginosis.  Treat as indicated.  Encouraged condom use with every sexual encounter to prevent spread of STI. ?Final Clinical Impressions(s) / UC Diagnoses  ? ?Final diagnoses:  ?Herpes simplex disease  ?Routine screening for STI (sexually transmitted infection)  ? ? ? ?Discharge Instructions   ? ?  ?- Please start Valtrex 500 mg twice daily for 3 days for the current outbreak ?- We will let you know with positive results and will send in treatment on Monday.  ?-Please use condoms with every sexual encounter to prevent spread of STI ? ? ? ? ?ED Prescriptions   ? ? Medication Sig Dispense Auth. Provider  ? valACYclovir (VALTREX) 500 MG tablet Take 1 tablet (500 mg total) by mouth 2 (two) times daily for 3 days. 6 tablet Eulogio Bear, NP  ? ?  ? ?PDMP not reviewed this encounter. ?  ?Eulogio Bear, NP ?05/30/21 1724 ? ?

## 2021-05-30 NOTE — Discharge Instructions (Addendum)
-   Please start Valtrex 500 mg twice daily for 3 days for the current outbreak ?- We will let you know with positive results and will send in treatment on Monday.  ?-Please use condoms with every sexual encounter to prevent spread of STI ?

## 2021-05-30 NOTE — ED Triage Notes (Signed)
Pt reports an out break of Herpes Zoster on external labia. ?

## 2021-05-31 LAB — RPR: RPR Ser Ql: NONREACTIVE

## 2021-06-01 LAB — CERVICOVAGINAL ANCILLARY ONLY
Bacterial Vaginitis (gardnerella): POSITIVE — AB
Candida Glabrata: NEGATIVE
Candida Vaginitis: NEGATIVE
Chlamydia: NEGATIVE
Comment: NEGATIVE
Comment: NEGATIVE
Comment: NEGATIVE
Comment: NEGATIVE
Comment: NEGATIVE
Comment: NORMAL
Neisseria Gonorrhea: NEGATIVE
Trichomonas: NEGATIVE

## 2021-06-02 ENCOUNTER — Telehealth (HOSPITAL_COMMUNITY): Payer: Self-pay | Admitting: Emergency Medicine

## 2021-06-02 LAB — HSV CULTURE AND TYPING

## 2021-06-02 MED ORDER — METRONIDAZOLE 500 MG PO TABS: 500.0000 mg | ORAL_TABLET | Freq: Two times a day (BID) | ORAL | 0 refills | Status: DC

## 2021-06-03 ENCOUNTER — Encounter (HOSPITAL_COMMUNITY): Payer: Self-pay

## 2021-06-24 ENCOUNTER — Ambulatory Visit: Payer: Medicaid Other | Admitting: Nurse Practitioner

## 2021-06-29 ENCOUNTER — Ambulatory Visit (HOSPITAL_COMMUNITY)
Admission: RE | Admit: 2021-06-29 | Discharge: 2021-06-29 | Disposition: A | Discharge status: Home / Self Care | Payer: Medicaid Other | Source: Ambulatory Visit | Attending: Family Medicine | Admitting: Family Medicine

## 2021-06-29 ENCOUNTER — Encounter (HOSPITAL_COMMUNITY): Payer: Self-pay

## 2021-06-29 VITALS — BP 116/77 | HR 100 | Temp 98.7°F | Resp 17 | Ht 66.0 in | Wt 134.0 lb

## 2021-06-29 DIAGNOSIS — N898 Other specified noninflammatory disorders of vagina: Secondary | ICD-10-CM

## 2021-06-29 DIAGNOSIS — R350 Frequency of micturition: Secondary | ICD-10-CM | POA: Diagnosis not present

## 2021-06-29 DIAGNOSIS — J0100 Acute maxillary sinusitis, unspecified: Secondary | ICD-10-CM

## 2021-06-29 DIAGNOSIS — A6000 Herpesviral infection of urogenital system, unspecified: Secondary | ICD-10-CM

## 2021-06-29 LAB — POCT URINALYSIS DIPSTICK, ED / UC
Bilirubin Urine: NEGATIVE
Glucose, UA: NEGATIVE mg/dL
Ketones, ur: NEGATIVE mg/dL
Leukocytes,Ua: NEGATIVE
Nitrite: NEGATIVE
Protein, ur: NEGATIVE mg/dL
Specific Gravity, Urine: 1.02 (ref 1.005–1.030)
Urobilinogen, UA: 0.2 mg/dL (ref 0.0–1.0)
pH: 5.5 (ref 5.0–8.0)

## 2021-06-29 LAB — POC URINE PREG, ED: Preg Test, Ur: NEGATIVE

## 2021-06-29 MED ORDER — DOXYCYCLINE HYCLATE 100 MG PO CAPS: 100.0000 mg | ORAL_CAPSULE | Freq: Two times a day (BID) | ORAL | 0 refills | Status: DC

## 2021-06-29 MED ORDER — METRONIDAZOLE 500 MG PO TABS: 500.0000 mg | ORAL_TABLET | Freq: Two times a day (BID) | ORAL | 0 refills | Status: DC

## 2021-06-29 MED ORDER — VALACYCLOVIR HCL 1 G PO TABS: 500.0000 mg | ORAL_TABLET | Freq: Two times a day (BID) | ORAL | 0 refills | Status: DC

## 2021-06-29 NOTE — ED Triage Notes (Addendum)
Pt reports urinary frequency, lower abdominal pain, and vaginal discharge x 1 week States she thinks she has BV.  Also reports intermittent migraines for the past week. States they occur every other day and have tried Excedrin and Tylenol with no relief.

## 2021-06-30 LAB — CERVICOVAGINAL ANCILLARY ONLY
Bacterial Vaginitis (gardnerella): POSITIVE — AB
Candida Glabrata: NEGATIVE
Candida Vaginitis: NEGATIVE
Chlamydia: NEGATIVE
Comment: NEGATIVE
Comment: NEGATIVE
Comment: NEGATIVE
Comment: NEGATIVE
Comment: NEGATIVE
Comment: NORMAL
Neisseria Gonorrhea: NEGATIVE
Trichomonas: NEGATIVE

## 2021-07-01 NOTE — ED Provider Notes (Signed)
[name]   [id number] 06/29/21 Arrival Time: 1018  ASSESSMENT & PLAN:  1. Vaginal discharge   2. Urinary frequency   3. Genital herpes simplex, unspecified site   4. Acute non-recurrent maxillary sinusitis    Recurrent genital herpes; supply for recurrent outbreaks given. Will treat empirically for BV with Flagyl. UPT negative. U/A without signs of infection. Start doxycycline for maxillary sinusitis.  Meds ordered this encounter  Medications   valACYclovir (VALTREX) 1000 MG tablet    Sig: Take 0.5 tablets (500 mg total) by mouth 2 (two) times daily. For 3 days. Repeat with recurrent outbreaks.    Dispense:  60 tablet    Refill:  0   metroNIDAZOLE (FLAGYL) 500 MG tablet    Sig: Take 1 tablet (500 mg total) by mouth 2 (two) times daily.    Dispense:  14 tablet    Refill:  0   doxycycline (VIBRAMYCIN) 100 MG capsule    Sig: Take 1 capsule (100 mg total) by mouth 2 (two) times daily.    Dispense:  20 capsule    Refill:  0   No empiric gonorrhea/chlamydia treatment; low concern. Without s/s of PID.  Labs Reviewed  POCT URINALYSIS DIPSTICK, ED / UC - Abnormal; Notable for the following components:      Result Value   Hgb urine dipstick SMALL (*)    All other components within normal limits  CERVICOVAGINAL ANCILLARY ONLY - Abnormal; Notable for the following components:   Bacterial Vaginitis (gardnerella) Positive (*)    All other components within normal limits  POC URINE PREG, ED     Will notify of any positive results. Instructed to refrain from sexual activity for at least seven days.  Reviewed expectations re: course of current medical issues. Questions answered. Outlined signs and symptoms indicating need for more acute intervention. Patient verbalized understanding. After Visit Summary given.   SUBJECTIVE:  Tammy Hale is a 36 y.o. female who presents with complaint of vaginal discharge; h/o BV with same symptoms; "feels the same". No specific  aggravating or alleviating factors reported. Occasional mild urinary frequency. Afebrile. No abdominal or pelvic pain. Normal PO intake wihout n/v. No genital rashes or lesions.  Also with several weeks of nasal congestion; seasonal allergies; now with sinus pain/pressure; feeing in teeth. No fever. OTC without help.  No LMP recorded. (Menstrual status: Irregular Periods).   OBJECTIVE:  Vitals:   06/29/21 1047 06/29/21 1049  BP:  116/77  Pulse:  100  Resp:  17  Temp:  98.7 F (37.1 C)  TempSrc:  Oral  SpO2:  97%  Weight: 60.8 kg   Height: 5\' 6"  (1.676 m)      General appearance: alert, cooperative, appears stated age and no distress HEENT: [place]; AT; bilateral maxillary sinus TTP; turbinates boggy Lungs: unlabored respirations; speaks full sentences without difficulty Back: no CVA tenderness; FROM at waist Abdomen: soft, non-tender GU: deferred Skin: warm and dry Psychological: alert and cooperative; normal mood and affect.  Results for orders placed or performed during the hospital encounter of 06/29/21  POC Urinalysis dipstick  Result Value Ref Range   Glucose, UA NEGATIVE NEGATIVE mg/dL   Bilirubin Urine NEGATIVE NEGATIVE   Ketones, ur NEGATIVE NEGATIVE mg/dL   Specific Gravity, Urine 1.020 1.005 - 1.030   Hgb urine dipstick SMALL (A) NEGATIVE   pH 5.5 5.0 - 8.0   Protein, ur NEGATIVE NEGATIVE mg/dL   Urobilinogen, UA 0.2 0.0 - 1.0 mg/dL   Nitrite NEGATIVE NEGATIVE  Leukocytes,Ua NEGATIVE NEGATIVE  POC urine pregnancy  Result Value Ref Range   Preg Test, Ur NEGATIVE NEGATIVE  Cervicovaginal ancillary only  Result Value Ref Range   Neisseria Gonorrhea Negative    Chlamydia Negative    Trichomonas Negative    Bacterial Vaginitis (gardnerella) Positive (A)    Candida Vaginitis Negative    Candida Glabrata Negative    Comment      Normal Reference Range Bacterial Vaginosis - Negative   Comment Normal Reference Range Candida Species - Negative    Comment Normal  Reference Range Candida Galbrata - Negative    Comment Normal Reference Range Trichomonas - Negative    Comment Normal Reference [place] Chlamydia - Negative    Comment      Normal Reference Range Neisseria Gonorrhea - Negative    Labs Reviewed  POCT URINALYSIS DIPSTICK, ED / UC - Abnormal; Notable for the following components:      Result Value   Hgb urine dipstick SMALL (*)    All other components within normal limits  CERVICOVAGINAL ANCILLARY ONLY - Abnormal; Notable for the following components:   Bacterial Vaginitis (gardnerella) Positive (*)    All other components within normal limits  POC URINE PREG, ED    Allergies  Allergen Reactions   Penicillins Anaphylaxis    Throat swelled up, went to ER    Past Medical History:  Diagnosis Date   Lupus (H[facility])    Family History  Problem Relation Age of Onset   Healthy Mother    Social History   Socioeconomic History   Marital status: Single    Spouse name: Not on file   Number of children: Not on file   Years of education: Not on file   Highest education level: Not on file  Occupational History   Not on file  Tobacco Use   Smoking status: Every Day    Packs/day: 0.50    Types: Cigarettes   Smokeless tobacco: Never  Vaping Use   Vaping Use: Never used  Substance and Sexual Activity   Alcohol use: No   Drug use: Not Currently    Types: Marijuana    Comment: former user   Sexual activity: Yes  Other Topics Concern   Not on file  Social History Narrative   Not on file   Social Determinants of Health   Financial Resource Strain: Not on file  Food Insecurity: Not on file  Transportation Needs: Not on file  Physical Activity: Not on file  Stress: Not on file  Social Connections: Not on file  Intimate Partner Violence: Not on file           Mardella Layman, MD 07/01/21 1059

## 2021-07-02 DIAGNOSIS — Z1231 Encounter for screening mammogram for malignant neoplasm of breast: Secondary | ICD-10-CM

## 2021-07-07 ENCOUNTER — Emergency Department (HOSPITAL_COMMUNITY): Payer: Medicaid Other

## 2021-07-07 ENCOUNTER — Encounter (HOSPITAL_COMMUNITY): Payer: Self-pay | Admitting: Radiology

## 2021-07-07 ENCOUNTER — Emergency Department (HOSPITAL_COMMUNITY)
Admission: EM | Admit: 2021-07-07 | Discharge: 2021-07-08 | Disposition: A | Discharge status: Home / Self Care | Payer: Medicaid Other | Attending: Emergency Medicine | Admitting: Emergency Medicine

## 2021-07-07 ENCOUNTER — Other Ambulatory Visit: Payer: Self-pay

## 2021-07-07 DIAGNOSIS — R519 Headache, unspecified: Secondary | ICD-10-CM

## 2021-07-07 DIAGNOSIS — G43909 Migraine, unspecified, not intractable, without status migrainosus: Secondary | ICD-10-CM | POA: Insufficient documentation

## 2021-07-07 DIAGNOSIS — R112 Nausea with vomiting, unspecified: Secondary | ICD-10-CM

## 2021-07-07 MED ORDER — ONDANSETRON 8 MG PO TBDP: 8.0000 mg | ORAL_TABLET | Freq: Three times a day (TID) | ORAL | 0 refills | Status: DC | PRN

## 2021-07-07 MED ORDER — HYDROMORPHONE HCL 1 MG/ML IJ SOLN
0.5000 mg | Freq: Once | INTRAMUSCULAR | Status: AC
  Administered 2021-07-07: 0.5 mg via INTRAVENOUS
  Filled 2021-07-07: qty 1

## 2021-07-07 MED ORDER — IBUPROFEN 200 MG PO TABS
400.0000 mg | ORAL_TABLET | Freq: Once | ORAL | Status: AC
  Administered 2021-07-07: 400 mg via ORAL
  Filled 2021-07-07: qty 2

## 2021-07-07 MED ORDER — TRAMADOL HCL 50 MG PO TABS
50.0000 mg | ORAL_TABLET | Freq: Once | ORAL | Status: AC
  Administered 2021-07-07: 50 mg via ORAL
  Filled 2021-07-07: qty 1

## 2021-07-07 MED ORDER — METOCLOPRAMIDE HCL 5 MG/ML IJ SOLN
10.0000 mg | Freq: Once | INTRAMUSCULAR | Status: AC
  Administered 2021-07-07: 10 mg via INTRAVENOUS
  Filled 2021-07-07: qty 2

## 2021-07-07 NOTE — ED Provider Notes (Signed)
[place] DEPT Provider Note   CSN: [id number] Arrival date & time: 07/07/21  2048     History  Chief Complaint  Patient presents with   Migraine    Tammy Hale is a 36 y.o. female.  Patient c/o 'migraine', frontal headache, acute onset this evening, at rest. Pt notes hx migraines. +nausea/vomiting. No bloody or bilious emesis. Denies recent sinus congestion, drainage or pain. No cough, uri or allergy symptoms. Denies recent head  trauma or fall. No eye pain or change in vision, although indicates bright lights bother eyes. No neck pain or stiffness. No associated numbness/weakness. No fever or chills. Tried taking tylenol sinus - no improvement.   The history is provided by the patient, medical records and the EMS personnel.  Migraine Associated symptoms include headaches. Pertinent negatives include no chest pain, no abdominal pain and no shortness of breath.      Home Medications Prior to Admission medications   Medication Sig Start Date End Date Taking? Authorizing Provider  cetirizine (ZYRTEC ALLERGY) 10 MG tablet Take 1 tablet (10 mg total) by mouth daily. 05/02/20   Volney American, PA-C  cyclobenzaprine (FLEXERIL) 10 MG tablet Take 1 tablet (10 mg total) by mouth 2 (two) times daily as needed for muscle spasms. 12/04/20   White, Leitha Schuller, NP  doxycycline (VIBRAMYCIN) 100 MG capsule Take 1 capsule (100 mg total) by mouth 2 (two) times daily. 06/29/21   Vanessa Kick, MD  fluticasone (FLONASE) 50 MCG/ACT nasal spray Place 1 spray into both nostrils in the morning and at bedtime. 05/02/20   Volney American, PA-C  metroNIDAZOLE (FLAGYL) 500 MG tablet Take 1 tablet (500 mg total) by mouth 2 (two) times daily. 06/29/21   Vanessa Kick, MD  valACYclovir (VALTREX) 1000 MG tablet Take 0.5 tablets (500 mg total) by mouth 2 (two) times daily. For 3 days. Repeat with recurrent outbreaks. 06/29/21   Vanessa Kick, MD      Allergies     Penicillins    Review of Systems   Review of Systems  Constitutional:  Negative for chills and fever.  HENT:  Negative for sinus pain and sore throat.   Eyes:  Negative for redness and visual disturbance.  Respiratory:  Negative for cough and shortness of breath.   Cardiovascular:  Negative for chest pain.  Gastrointestinal:  Positive for nausea and vomiting. Negative for abdominal pain.  Genitourinary:  Negative for dysuria.  Musculoskeletal:  Negative for back pain, neck pain and neck stiffness.  Skin:  Negative for rash.  Neurological:  Positive for headaches. Negative for syncope, weakness and numbness.  Hematological:  Does not bruise/bleed easily.  Psychiatric/Behavioral:  Negative for confusion.    Physical Exam Updated Vital Signs BP (!) 105/50   Pulse (!) 59   Temp 98 F (36.7 C)   Resp 18   Ht 1.676 m (5\' 6" )   Wt 60.8 kg   SpO2 99%   BMI 21.63 kg/m  Physical Exam Vitals and nursing note reviewed.  Constitutional:      Appearance: Normal appearance. She is well-developed.  HENT:     Head: Atraumatic.     Comments: No sinus or temporal tenderness.     Nose: Nose normal.     Mouth/Throat:     Mouth: Mucous membranes are moist.  Eyes:     General: No scleral icterus.    Conjunctiva/sclera: Conjunctivae normal.     Pupils: Pupils are equal, round, and reactive to light.  Neck:  Trachea: No tracheal deviation.     Comments: No stiffness or rigidity.  Cardiovascular:     Rate and Rhythm: Normal rate and regular rhythm.     Pulses: Normal pulses.     Heart sounds: Normal heart sounds. No murmur heard.   No friction rub. No gallop.  Pulmonary:     Effort: Pulmonary effort is normal. No respiratory distress.     Breath sounds: Normal breath sounds.  Abdominal:     General: Bowel sounds are normal. There is no distension.     Palpations: Abdomen is soft.     Tenderness: There is no abdominal tenderness.  Genitourinary:    Comments: No cva tenderness.   Musculoskeletal:        General: No swelling or tenderness.     Cervical back: Normal range of motion and neck supple. No rigidity. No muscular tenderness.  Skin:    General: Skin is warm and dry.     Findings: No rash.  Neurological:     Mental Status: She is alert and oriented to person, place, and time.     Cranial Nerves: No cranial nerve deficit.     Comments: Alert, speech normal. Motor/sens grossly intact bil.   Psychiatric:        Mood and Affect: Mood normal.    ED Results / Procedures / Treatments   Labs (all labs ordered are listed, but only abnormal results are displayed) Labs Reviewed - No data to display  EKG None  Radiology CT Head Wo Contrast  Result Date: 07/07/2021 CLINICAL DATA:  Migraine headache for 2 hours, nausea and vomiting, photophobia EXAM: CT HEAD WITHOUT CONTRAST TECHNIQUE: Contiguous axial images were obtained from the base of the skull through the vertex without intravenous contrast. RADIATION DOSE REDUCTION: This exam was performed according to the departmental dose-optimization program which includes automated exposure control, adjustment of the mA and/or kV according to patient size and/or use of iterative reconstruction technique. COMPARISON:  None Available. FINDINGS: Brain: No acute infarct or hemorrhage. Lateral ventricles and midline structures are grossly unremarkable. No acute extra-axial fluid collections. No mass effect. Vascular: No hyperdense vessel or unexpected calcification. Skull: Normal. Negative for fracture or focal lesion. Sinuses/Orbits: No acute finding. Other: None. IMPRESSION: 1. No acute intracranial process. Electronically Signed   By: Randa Ngo M.D.   On: 07/07/2021 21:39    Procedures Procedures    Medications Ordered in ED Medications  HYDROmorphone (DILAUDID) injection 0.5 mg (0.5 mg Intravenous Given 07/07/21 2117)  metoCLOPramide (REGLAN) injection 10 mg (10 mg Intravenous Given 07/07/21 2117)    ED Course/  Medical Decision Making/ A&P                           Medical Decision Making Problems Addressed: Frontal headache: acute illness or injury with systemic symptoms Nausea and vomiting in adult: acute illness or injury with systemic symptoms that poses a threat to life or bodily functions  Amount and/or Complexity of Data Reviewed External Data Reviewed: notes. Radiology: ordered and independent interpretation performed. Decision-making details documented in ED Course.  Risk OTC drugs. Prescription drug management.   Iv ns. Continuous pulse ox and cardiac monitoring.maging ordered.   Reviewed nursing notes and prior charts for additional history. External reports reviewed. Additional history from: EMS.   Cardiac monitor: sinus rhythm, rate 60.   CT reviewed/interpreted by me - no hem.   Reglan iv. Dilaudid iv.   Po fluids. Headache improved,  not entirely resolved. No nv.   Ibuprofen po. Ultram po.  Pt comfortable appearing, tolerating po.  Pt appears stable for d/c.   Rec pcp f/u.  Return precautions provided.              Final Clinical Impression(s) / ED Diagnoses Final diagnoses:  None    Rx / DC Orders ED Discharge Orders     None         Lajean Saver, MD 07/07/21 2301

## 2021-07-07 NOTE — ED Triage Notes (Signed)
Pt taking antibiotics for sinus infection.

## 2021-07-07 NOTE — Discharge Instructions (Addendum)
It was our pleasure to provide your ER care today - we hope that you feel better.  Drink plenty of fluids/stay well hydrated.   Take excedrin or ibuprofen as need. Take zofran as need for nausea.   Follow up with primary care doctor in the next couple of days if symptoms fail to improve/resolve.  Return to ER if worse, new symptoms, fevers, worsening/severe pain, persistent vomiting, or other concern.   You were given pain meds in the ER - no driving for the next 8 hours.

## 2021-07-07 NOTE — ED Triage Notes (Signed)
Complaints of migraine for the past 2 hours. Positive for N/V/light sensitivity. Pt states she doesn't take medication for migraines at baseline.

## 2021-07-13 ENCOUNTER — Ambulatory Visit: Payer: Medicaid Other | Admitting: Physician Assistant

## 2021-07-15 NOTE — Patient Instructions (Signed)
Major Depressive Disorder, Adult Major depressive disorder (MDD) is a mental health condition. It may also be called clinical depression or unipolar depression. MDD causes symptoms of sadness, hopelessness, and loss of interest in things. These symptoms last most of the day, almost every day, for 2 weeks. MDD can also cause physical symptoms. It can interfere with relationships and with everyday activities, such as work, school, and activities that are usually pleasant. MDD may be mild, moderate, or severe. It may be single-episode MDD, which happens once, or recurrent MDD, which may occur multiple times. What are the causes? The exact cause of this condition is not known. MDD is most likely caused by a combination of things, which may include: Your personality traits. Learned or conditioned behaviors or thoughts or feelings that reinforce negativity. Any alcohol or substance misuse. Long-term (chronic) physical or mental health illness. Going through a traumatic experience or major life changes. What increases the risk? The following factors may make someone more likely to develop MDD: A family history of depression. Being a woman. Troubled family relationships. Abnormally low levels of certain brain chemicals. Traumatic or painful events in childhood, especially abuse or loss of a parent. A lot of stress from life experiences, such as poor living conditions or discrimination. Chronic physical illness or other mental health disorders. What are the signs or symptoms? The main symptoms of MDD usually include: Constant depressed or irritable mood. A loss of interest in things and activities. Other symptoms include: Sleeping or eating too much or too little. Unexplained weight gain or weight loss. Tiredness or low energy. Being agitated, restless, or weak. Feeling hopeless, worthless, or guilty. Trouble thinking clearly or making decisions. Thoughts of suicide or thoughts of harming  others. Isolating oneself or avoiding other people or activities. Trouble completing tasks, work, or any normal obligations. Severe symptoms of this condition may include: Psychotic depression.This may include false beliefs, or delusions. It may also include seeing, hearing, tasting, smelling, or feeling things that are not real (hallucinations). Chronic depression or persistent depressive disorder. This is low-level depression that lasts for at least 2 years. Melancholic depression, or feeling extremely sad and hopeless. Catatonic depression, which includes trouble speaking and trouble moving. How is this diagnosed? This condition may be diagnosed based on: Your symptoms. Your medical and mental health history. You may be asked questions about your lifestyle, including any drug and alcohol use. A physical exam. Blood tests to rule out other conditions. MDD is confirmed if you have the following symptoms most of the day, nearly every day, in a 2-week period: Either a depressed mood or loss of interest. At least four other MDD symptoms. How is this treated? This condition is usually treated by mental health professionals, such as psychologists, psychiatrists, and clinical social workers. You may need more than one type of treatment. Treatment may include: Psychotherapy, also called talk therapy or counseling. Types of psychotherapy include: Cognitive behavioral therapy (CBT). This teaches you to recognize unhealthy feelings, thoughts, and behaviors, and replace them with positive thoughts and actions. Interpersonal therapy (IPT). This helps you to improve the way you communicate with others or relate to them. Family therapy. This treatment includes members of your family. Medicines to treat anxiety and depression. These medicines help to balance the brain chemicals that affect your emotions. Lifestyle changes. You may be asked to: Limit alcohol use and avoid drug use. Get regular  exercise. Get plenty of sleep. Make healthy eating choices. Spend more time outdoors. Brain stimulation. This may   be done if symptoms are very severe and other treatments have not worked. Examples of this treatment are electroconvulsive therapy and transcranial magnetic stimulation. Follow these instructions at home: Activity Exercise regularly and spend time outdoors. Find activities that you enjoy doing, and make time to do them. Find healthy ways to manage stress, such as: Meditation or deep breathing. Spending time in nature. Journaling. Return to your normal activities as told by your health care provider. Ask your health care provider what activities are safe for you. Alcohol and drug use If you drink alcohol: Limit how much you use to: 0-1 drink a day for women who are not pregnant. 0-2 drinks a day for men. Be aware of how much alcohol is in your drink. In the [place] one drink equals one 12 oz bottle of beer (355 mL), one 5 oz glass of wine (148 mL), or one 1 oz glass of hard liquor (44 mL). Discuss your alcohol use with your health care provider. Alcohol can affect any antidepressant medicines you are taking. Discuss any drug use with your health care provider. General instructions  Take over-the-counter and prescription medicines only as told by your health care provider. Eat a healthy diet and get plenty of sleep. Consider joining a support group. Your health care provider may be able to recommend one. Keep all follow-up visits as told by your health care provider. This is important. Where to find more information National Alliance on Mental Illness: www.nami.org [place] National Institute of Mental Health: www.nimh.nih.gov Contact a health care provider if: Your symptoms get worse. You develop new symptoms. Get help right away if: You self-harm. You have serious thoughts about hurting yourself or others. You hallucinate. If you ever feel like you may hurt yourself or  others, or have thoughts about taking your own life, get help right away. Go to your nearest emergency department or: Call your local emergency services (911 in the [place] Call a suicide crisis helpline, such as the National Suicide Prevention Lifeline at 1-800-273-8255 or 988 in the [place] This is open 24 hours a day in the [place] Text the Crisis Text Line at 741741 (in the [place] Summary Major depressive disorder (MDD) is a mental health condition. MDD causes symptoms of sadness, hopelessness, and loss of interest in things. These symptoms last most of the day, almost every day, for 2 weeks. The symptoms of MDD can interfere with relationships and with everyday activities. Treatments and support are available for people who develop MDD. You may need more than one type of treatment. Get help right away if you have serious thoughts about hurting yourself or others. This information is not intended to replace advice given to you by your health care provider. Make sure you discuss any questions you have with your health care provider. Document Revised: 08/20/2020 Document Reviewed: 01/06/2019 Elsevier Patient Education  2023 Elsevier Inc.  

## 2021-07-16 ENCOUNTER — Encounter: Payer: Self-pay | Admitting: Physician Assistant

## 2021-07-16 ENCOUNTER — Ambulatory Visit (INDEPENDENT_AMBULATORY_CARE_PROVIDER_SITE_OTHER): Payer: Medicaid Other | Admitting: Physician Assistant

## 2021-07-16 VITALS — BP 96/61 | HR 86 | Temp 97.7°F | Ht 66.0 in | Wt 130.0 lb

## 2021-07-16 DIAGNOSIS — Z720 Tobacco use: Secondary | ICD-10-CM | POA: Diagnosis not present

## 2021-07-16 DIAGNOSIS — Z7689 Persons encountering health services in other specified circumstances: Secondary | ICD-10-CM

## 2021-07-16 DIAGNOSIS — F419 Anxiety disorder, unspecified: Secondary | ICD-10-CM

## 2021-07-16 DIAGNOSIS — F331 Major depressive disorder, recurrent, moderate: Secondary | ICD-10-CM | POA: Diagnosis not present

## 2021-07-16 DIAGNOSIS — Z716 Tobacco abuse counseling: Secondary | ICD-10-CM

## 2021-07-16 MED ORDER — PAROXETINE HCL 10 MG PO TABS: 10.0000 mg | ORAL_TABLET | Freq: Every day | ORAL | 0 refills | Status: DC

## 2021-07-16 MED ORDER — NICOTINE 21 MG/24HR TD PT24
21.0000 mg | MEDICATED_PATCH | Freq: Every day | TRANSDERMAL | 0 refills | Status: DC
Start: 2021-07-16 — End: 2022-03-05

## 2021-07-16 NOTE — Progress Notes (Signed)
New Patient Office Visit  Subjective    Patient ID: Tammy Hale, female    DOB: 20-Oct-1985  Age: 36 y.o. MRN: [id number]  CC:  Chief Complaint  Patient presents with   New Patient (Initial Visit)    HPI Tammy Hale presents to establish care. Patient reports remote history of depression, anxiety and PTSD. Patient reports it has been a few years since she has been to a PCP. Patient reports currently going through some depression. States was laid off from her job this week which has contributed to feeling more stress and down. Is currently fighting for the custody of her children. States lost custody in 2019 due to being homeless and unable to take care of them at that time. Patient endorses thoughts about not being here but denies any active plan or self-harm actions. States her anxiety is high too. Does not like large crowds and tends avoid going places if possible such as [employer]. Patient states has been struggling with her appetite and forces herself to eat. Smokes about 1 PPD to cope with her stress but would like to quit.     Outpatient Encounter Medications as of 07/16/2021  Medication Sig   nicotine (NICODERM CQ) 21 mg/24hr patch Place 1 patch (21 mg total) onto the skin daily.   PARoxetine (PAXIL) 10 MG tablet Take 1 tablet (10 mg total) by mouth daily.   valACYclovir (VALTREX) 1000 MG tablet Take 0.5 tablets (500 mg total) by mouth 2 (two) times daily. For 3 days. Repeat with recurrent outbreaks.   [DISCONTINUED] cetirizine (ZYRTEC ALLERGY) 10 MG tablet Take 1 tablet (10 mg total) by mouth daily. (Patient not taking: Reported on 07/16/2021)   [DISCONTINUED] cyclobenzaprine (FLEXERIL) 10 MG tablet Take 1 tablet (10 mg total) by mouth 2 (two) times daily as needed for muscle spasms. (Patient not taking: Reported on 07/16/2021)   [DISCONTINUED] doxycycline (VIBRAMYCIN) 100 MG capsule Take 1 capsule (100 mg total) by mouth 2 (two) times daily. (Patient not taking: Reported on 07/16/2021)    [DISCONTINUED] fluticasone (FLONASE) 50 MCG/ACT nasal spray Place 1 spray into both nostrils in the morning and at bedtime. (Patient not taking: Reported on 07/16/2021)   [DISCONTINUED] metroNIDAZOLE (FLAGYL) 500 MG tablet Take 1 tablet (500 mg total) by mouth 2 (two) times daily. (Patient not taking: Reported on 07/16/2021)   [DISCONTINUED] ondansetron (ZOFRAN-ODT) 8 MG disintegrating tablet Take 1 tablet (8 mg total) by mouth every 8 (eight) hours as needed for nausea or vomiting. (Patient not taking: Reported on 07/16/2021)   No facility-administered encounter medications on file as of 07/16/2021.    Past Medical History:  Diagnosis Date   Lupus ([place])     Past Surgical History:  Procedure Laterality Date   HERNIA REPAIR      Family History  Problem Relation Age of Onset   Healthy Mother     Social History   Socioeconomic History   Marital status: Single    Spouse name: Not on file   Number of children: 4   Years of education: Not on file   Highest education level: High school graduate  Occupational History   Occupation: unemployeed  Tobacco Use   Smoking status: Every Day    Packs/day: 1.00    Types: Cigarettes    Start date: 2014   Smokeless tobacco: Never  Vaping Use   Vaping Use: Never used  Substance and Sexual Activity   Alcohol use: No   Drug use: Yes    Types: Marijuana  Comment: former user   Sexual activity: Yes    Partners: Male    Birth control/protection: None  Other Topics Concern   Not on file  Social History Narrative   Not on file   Social Determinants of Health   Financial Resource Strain: Not on file  Food Insecurity: Not on file  Transportation Needs: Not on file  Physical Activity: Not on file  Stress: Not on file  Social Connections: Not on file  Intimate Partner Violence: Not on file       07/16/2021    2:25 PM 08/24/2017    4:22 PM  Depression screen PHQ 2/9  Decreased Interest 1 2  Down, Depressed, Hopeless 1 2  PHQ - 2 Score  2 4  Altered sleeping 2 1  Tired, decreased energy 2 2  Change in appetite 2 2  Feeling bad or failure about yourself  2 2  Trouble concentrating 1 3  Moving slowly or fidgety/restless 2 2  Suicidal thoughts 1 2  PHQ-9 Score 14 18  Difficult doing work/chores Very difficult       07/16/2021    2:25 PM 08/24/2017    4:22 PM  GAD 7 : Generalized Anxiety Score  Nervous, Anxious, on Edge 1 2  Control/stop worrying 2 2  Worry too much - different things 2   Trouble relaxing 2 1  Restless 1 2  Easily annoyed or irritable 1 2  Afraid - awful might happen 1 2  Total GAD 7 Score 10   Anxiety Difficulty Very difficult       ROS Review of Systems:  A fourteen system review of systems was performed and found to be positive as per HPI.      Objective    BP 96/61   Pulse 86   Temp 97.7 F (36.5 C)   Ht 5\' 6"  (1.676 m)   Wt 130 lb (59 kg)   LMP  (LMP Unknown)   SpO2 100%   BMI 20.98 kg/m   Physical Exam General:  Cooperative, in no acute distress,  appropriate for stated age.  Neuro:  Alert and oriented,  extra-ocular muscles intact  HEENT:  Normocephalic, atraumatic, neck supple  Skin:  no gross rash, warm, pink. Cardiac:  RRR, S1 S2 Respiratory: CTA B/L  Vascular:  Ext warm, no cyanosis apprec.; cap RF less 2 sec. Psych:  No HI/SI, judgement and insight good, Euthymic mood. Full Affect.      Assessment & Plan:   Problem List Items Addressed This Visit       Other   Moderate episode of recurrent major depressive disorder (HCC) - Primary    -Reviewed last visit with internal medicine ([facility]) 01/15/2019 and patient has previously been on Buspar 15 mg and Zoloft 75 mg. PHQ-9 score of 14 and patient is able to contract for safety. Discussed starting SSRI therapy and will start paroxetine 10 mg which can also help with appetite. Discussed with patient potential side effects and advised if unable to tolerate medication to let me know. If mood worsens recommend to  stop medication. Will reassess mood and medication therapy in 4 weeks.       Relevant Medications   PARoxetine (PAXIL) 10 MG tablet   Anxiety    -GAD-7 score of 10. Will start paroxetine 10 mg. Will reassess mood and medication therapy in 4 weeks.       Relevant Medications   PARoxetine (PAXIL) 10 MG tablet   Other Visit Diagnoses  Tobacco use       Relevant Medications   nicotine (NICODERM CQ) 21 mg/24hr patch   Encounter to establish care       Tobacco abuse counseling          Encounter to establish care: -Reviewed PCP records in Epic from [facility]. Patient has hx of Leep procedure July 2019 and last pap 01/15/2019 which was negative for intraepithelial lesion or malignancy. Recommend to schedule CPE w/ pap and FBW later this year.  Tobacco abuse counseling: -The patient was counseled on the dangers of tobacco use, and was advised to quit.  Reviewed strategies to maximize success, including stress management, support of family/friends, and pharmacotherapy (NRT). Patient agreeable to start NicoDerm.      Return in about 4 weeks (around 08/13/2021) for Mood- started med, tobacco use.   Lorrene Reid, PA-C

## 2021-07-16 NOTE — Assessment & Plan Note (Signed)
-  Reviewed last visit with internal medicine ([facility]) 01/15/2019 and patient has previously been on Buspar 15 mg and Zoloft 75 mg. PHQ-9 score of 14 and patient is able to contract for safety. Discussed starting SSRI therapy and will start paroxetine 10 mg which can also help with appetite. Discussed with patient potential side effects and advised if unable to tolerate medication to let me know. If mood worsens recommend to stop medication. Will reassess mood and medication therapy in 4 weeks.

## 2021-07-16 NOTE — Assessment & Plan Note (Signed)
-  GAD-7 score of 10. Will start paroxetine 10 mg. Will reassess mood and medication therapy in 4 weeks.

## 2021-08-07 ENCOUNTER — Other Ambulatory Visit: Payer: Self-pay | Admitting: Physician Assistant

## 2021-08-07 DIAGNOSIS — F331 Major depressive disorder, recurrent, moderate: Secondary | ICD-10-CM

## 2021-08-07 DIAGNOSIS — F419 Anxiety disorder, unspecified: Secondary | ICD-10-CM

## 2022-02-12 ENCOUNTER — Telehealth: Payer: Medicaid Other

## 2022-02-12 ENCOUNTER — Ambulatory Visit (HOSPITAL_COMMUNITY)
Admission: EM | Admit: 2022-02-12 | Discharge: 2022-02-12 | Disposition: A | Discharge status: Home / Self Care | Payer: Medicaid Other | Attending: Emergency Medicine | Admitting: Emergency Medicine

## 2022-02-12 ENCOUNTER — Encounter (HOSPITAL_COMMUNITY): Payer: Self-pay | Admitting: Emergency Medicine

## 2022-02-12 DIAGNOSIS — R202 Paresthesia of skin: Secondary | ICD-10-CM | POA: Diagnosis not present

## 2022-02-12 DIAGNOSIS — G5621 Lesion of ulnar nerve, right upper limb: Secondary | ICD-10-CM

## 2022-02-12 MED ORDER — PREDNISONE 10 MG (21) PO TBPK: ORAL_TABLET | ORAL | 0 refills | Status: DC

## 2022-02-12 MED ORDER — NAPROXEN 375 MG PO TABS: 375.0000 mg | ORAL_TABLET | Freq: Two times a day (BID) | ORAL | 0 refills | Status: AC

## 2022-02-12 NOTE — ED Provider Notes (Signed)
HPI  SUBJECTIVE:  Tammy Hale is a left handed 37 y.o. female who presents with 2 days right elbow and forearm numbness and tingling, pins-and-needles like pain after sleeping on it.  She states the pain radiates to her right middle, ring, little fingers.  She does a lot of repetitive work at her job, but has not  increased her hours.  She denies grip weakness, direct trauma to the elbow, joint erythema, swelling.  No headache, neck pain, dizziness.  She states that this sensation is identical to previous lupus flares.  She states she usually gets numbness and tingling in her legs when her lupus flares up.  It tends to flareup with the change in weather.  She has not tried anything for this.  No alleviating factors.  Symptoms are worse with palpation and with the use.  She has a past medical history of lupus.  No history of chronic kidney disease.  [facility].    Past Medical History:  Diagnosis Date   Lupus [facility])     Past Surgical History:  Procedure Laterality Date   HERNIA REPAIR      Family History  Problem Relation Age of Onset   Healthy Mother     Social History   Tobacco Use   Smoking status: Every Day    Packs/day: 1.00    Types: Cigarettes    Start date: 2014   Smokeless tobacco: Never  Vaping Use   Vaping Use: Never used  Substance Use Topics   Alcohol use: No   Drug use: Yes    Types: Marijuana    Comment: former user    No current facility-administered medications for this encounter.  Current Outpatient Medications:    naproxen (NAPROSYN) 375 MG tablet, Take 1 tablet (375 mg total) by mouth 2 (two) times daily for 5 days., Disp: 10 tablet, Rfl: 0   predniSONE (STERAPRED UNI-PAK 21 TAB) 10 MG (21) TBPK tablet, Dispense one 6 day pack. Take as directed with food., Disp: 21 tablet, Rfl: 0   nicotine (NICODERM CQ) 21 mg/24hr patch, Place 1 patch (21 mg total) onto the skin daily., Disp: 28 patch, Rfl: 0   PARoxetine (PAXIL) 10 MG tablet,  TAKE 1 TABLET BY MOUTH EVERY DAY, Disp: 30 tablet, Rfl: 0   valACYclovir (VALTREX) 1000 MG tablet, Take 0.5 tablets (500 mg total) by mouth 2 (two) times daily. For 3 days. Repeat with recurrent outbreaks., Disp: 60 tablet, Rfl: 0  Allergies  Allergen Reactions   Penicillins Anaphylaxis    Throat swelled up, went to ER     ROS  As noted in HPI.   Physical Exam  BP 117/74 (BP Location: Left Arm)   Pulse 74   Temp 98.5 F (36.9 C) (Oral)   Resp 16   LMP 02/12/2022   SpO2 100%   Constitutional: Well developed, well nourished, no acute distress Eyes:  EOMI, conjunctiva normal bilaterally HENT: Normocephalic, atraumatic,mucus membranes moist Respiratory: Normal inspiratory effort Cardiovascular: Normal rate GI: nondistended skin: No rash, skin intact Musculoskeletal: No shoulder, upper arm tenderness.  No supracondylar, olecranon, lateral epicondyle tenderness.  Positive tenderness over the medial epicondyle and the ulnar groove.  Palpation ulnar nerve reproduces her symptoms which are located primarily in the middle, ring, little fingers.  No pain with pronation, supination, flexion/extension of the arm.  No tenderness to the forearm, wrist, hand.  No limitation of motion of the wrist or hand.  Sensation and motor intact in the median/radial/ulnar distribution.  RP 2+. Neurologic: Alert & oriented x 3, no focal neuro deficits Psychiatric: Speech and behavior appropriate   ED Course   Medications - No data to display  No orders of the defined types were placed in this encounter.   No results found for this or any previous visit (from the past 24 hour(s)). No results found.  ED Clinical Impression  1. Entrapment of right ulnar nerve   2. Paresthesias in right hand      ED Assessment/Plan     Suspect lupus flare versus compression of the ulnar nerve from her sleeping on it.  No evidence of stroke.  Will send home with 6-day prednisone taper starting at 60 mg, will  try Tylenol/short course of low-dose Naprosyn.  She plans to follow-up with her PCP.   Discussed MDM, treatment plan, and plan for follow-up with patient. Discussed sn/sx that should prompt return to the ED. patient agrees with plan.   Meds ordered this encounter  Medications   predniSONE (STERAPRED UNI-PAK 21 TAB) 10 MG (21) TBPK tablet    Sig: Dispense one 6 day pack. Take as directed with food.    Dispense:  21 tablet    Refill:  0   naproxen (NAPROSYN) 375 MG tablet    Sig: Take 1 tablet (375 mg total) by mouth 2 (two) times daily for 5 days.    Dispense:  10 tablet    Refill:  0      *This clinic note was created using [employer]. Therefore, there may be occasional mistakes despite careful proofreading.  ?    Melynda Ripple, MD 02/12/22 [phone]

## 2022-02-12 NOTE — ED Triage Notes (Signed)
Yesterday got home, fell asleep on couch, woke up with her right arm feeling numb and tingling. States the sensation has not resolved. Describes similar feelings in her right shoulder as well. Denies any neck pain, recent trauma to neck/arm, headaches, LOC, dizziness, weakness, fever, N/V/D. 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 Describes the pain as a shooting-like pain, 7/10, starting from her elbow and moving into her hand. Tender to palpation at right elbow. Grip strength 5/5, pain worse with resisted ROM exercises, retains full ROM in shoulders, elbows, hands. Was able to sense touch up entire right arm. Hx of lupus, states she believes it may be due to her lupus acting up, not currently on any medications for this at the moment.

## 2022-02-12 NOTE — Discharge Instructions (Signed)
Finish steroids, even if you feel better.  You can try some ice on the area.  Take the Naprosyn with 1000 mg of Tylenol twice a day.  You may take an additional 1000 mg of Tylenol per day.  Please follow-up with your primary care provider soon as possible.

## 2022-03-02 ENCOUNTER — Ambulatory Visit (HOSPITAL_COMMUNITY): Payer: Medicaid Other

## 2022-03-05 ENCOUNTER — Encounter (HOSPITAL_COMMUNITY): Payer: Self-pay

## 2022-03-05 ENCOUNTER — Telehealth (HOSPITAL_COMMUNITY): Payer: Self-pay

## 2022-03-05 ENCOUNTER — Ambulatory Visit (HOSPITAL_COMMUNITY)
Admission: RE | Admit: 2022-03-05 | Discharge: 2022-03-05 | Disposition: A | Discharge status: Home / Self Care | Payer: Medicaid Other | Source: Ambulatory Visit | Attending: Emergency Medicine | Admitting: Emergency Medicine

## 2022-03-05 VITALS — BP 100/65 | HR 78 | Temp 99.4°F | Resp 16

## 2022-03-05 DIAGNOSIS — M329 Systemic lupus erythematosus, unspecified: Secondary | ICD-10-CM | POA: Diagnosis not present

## 2022-03-05 MED ORDER — PREDNISONE 10 MG (21) PO TBPK: ORAL_TABLET | Freq: Every day | ORAL | 0 refills | Status: DC

## 2022-03-05 MED ORDER — CYCLOBENZAPRINE HCL 10 MG PO TABS: 10.0000 mg | ORAL_TABLET | Freq: Two times a day (BID) | ORAL | 0 refills | Status: DC | PRN

## 2022-03-05 NOTE — ED Provider Notes (Signed)
[facility]    CSN: [id number] Arrival date & time: 03/05/22  1503     History   Chief Complaint Chief Complaint  Patient presents with   Joint Pain    HPI Tammy Hale is a 37 y.o. female.  Here with reported lupus flare up For the last week, having bilateral arm and leg pain. Feels some swelling in the lower legs. No reported numbness, tingling or weakness. She reports her flare ups are usually like this.  Was seen 1/5, given 6 day steroid taper that helped but then symptoms returned  Today tried ibuprofen   She denies any fever, rash, chest pain, shortness of breath, abdominal pain, urinary symptoms including hematuria, vomiting/diarrhea.. Denies dizziness, feeling lightheaded, vision changes.  She does not see a specialist. Has never taken hydroxychlorquine Has PCP visit next month  Past Medical History:  Diagnosis Date   Lupus [facility])     Patient Active Problem List   Diagnosis Date Noted   Moderate episode of recurrent major depressive disorder (HCC) 07/16/2021   Anxiety 07/16/2021   Fracture of middle phalanx of finger 03/17/2020   Pain in right hand 03/17/2020   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   Moderate cervical atypia 08/24/2017   Chronic pain of both lower extremities 01/13/2017    Past Surgical History:  Procedure Laterality Date   HERNIA REPAIR      OB History   No obstetric history on file.      Home Medications    Prior to Admission medications   Medication Sig Start Date End Date Taking? Authorizing Provider  cyclobenzaprine (FLEXERIL) 10 MG tablet Take 1 tablet (10 mg total) by mouth 2 (two) times daily as needed for muscle spasms. 03/05/22   Lamptey, Britta Mccreedy, MD  predniSONE (STERAPRED UNI-PAK 21 TAB) 10 MG (21) TBPK tablet Take by mouth daily. Take 6 tabs by mouth daily for 2 days, then 5 tabs for 2 days, then 4 tabs for 2 days, then 3 tabs for 2 days, 2 tabs for 2 days, then 1 tab by mouth daily for 2 days 03/05/22    Merrilee Jansky, MD  valACYclovir (VALTREX) 1000 MG tablet Take 0.5 tablets (500 mg total) by mouth 2 (two) times daily. For 3 days. Repeat with recurrent outbreaks. 06/29/21   Mardella Layman, MD    Family History Family History  Problem Relation Age of Onset   Healthy Mother     Social History Social History   Tobacco Use   Smoking status: Every Day    Packs/day: 1.00    Types: Cigarettes    Start date: 2014   Smokeless tobacco: Never  Vaping Use   Vaping Use: Never used  Substance Use Topics   Alcohol use: No   Drug use: Yes    Types: Marijuana    Comment: former user     Allergies   Penicillins   Review of Systems Review of Systems   Physical Exam Triage Vital Signs ED Triage Vitals  Enc Vitals Group     BP 03/05/22 1519 100/65     Pulse Rate 03/05/22 1519 78     Resp 03/05/22 1519 16     Temp 03/05/22 1519 99.4 F (37.4 C)     Temp Source 03/05/22 1519 Oral     SpO2 03/05/22 1519 97 %     Weight --      Height --      Head Circumference --      Peak Flow --  Pain Score 03/05/22 1520 8     Pain Loc --      Pain Edu? --      Excl. in [place]? --    No data found.  Updated Vital Signs BP 100/65 (BP Location: Left Arm)   Pulse 78   Temp 99.4 F (37.4 C) (Oral)   Resp 16   LMP 02/12/2022   SpO2 97%   Physical Exam Vitals and nursing note reviewed.  Constitutional:      General: She is not in acute distress. HENT:     Mouth/Throat:     Mouth: Mucous membranes are moist.     Pharynx: Oropharynx is clear.  Eyes:     Extraocular Movements: Extraocular movements intact.     Conjunctiva/sclera: Conjunctivae normal.     Pupils: Pupils are equal, round, and reactive to light.  Cardiovascular:     Rate and Rhythm: Normal rate and regular rhythm.     Heart sounds: Normal heart sounds.  Pulmonary:     Effort: Pulmonary effort is normal. No respiratory distress.     Breath sounds: Normal breath sounds.  Musculoskeletal:        General: Normal  range of motion.     Cervical back: Normal range of motion.     Right lower leg: No edema.     Left lower leg: No edema.     Comments: No swelling of either LE  Skin:    General: Skin is warm and dry.     Findings: No rash.  Neurological:     Mental Status: She is alert and oriented to person, place, and time.     Sensory: No sensory deficit.     Motor: No weakness.     Coordination: Coordination normal.     Gait: Gait normal.     Comments: Strength 5/5 throughout. Sensation intact. Strong pulses. Full ROM of all extrem      UC Treatments / Results  Labs (all labs ordered are listed, but only abnormal results are displayed) Labs Reviewed - No data to display  EKG   Radiology No results found.  Procedures Procedures   Medications Ordered in UC Medications - No data to display  Initial Impression / Assessment and Plan / UC Course  I have reviewed the triage vital signs and the nursing notes.  Pertinent labs & imaging results that were available during my care of the patient were reviewed by me and considered in my medical decision making (see chart for details).  Lupus flare up, reported similar to previous Neuro exam intact, no concerning symptoms at this time Will try a little longer steroid taper - 12 day Patient reports relief with muscle relaxer in past. Flexaril BID Discussed ED precautions. Recommend close follow up with PCP, specialist. 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  Muscle relaxer can be taken twice daily.  Please monitor for change in symptoms. With any new or worsening symptoms, go to the emergency department.  Follow up with your primary care provider for follow up.  You can call the [place] office to make an appointment regarding restrictions  at work    ED Prescriptions     Medication Sig [place]. Provider   predniSONE (STERAPRED UNI-PAK 21 TAB) 10 MG (21) TBPK tablet Take  by mouth daily. Take 6 tabs by mouth daily for 2 days, then 5 tabs for 2 days, then 4 tabs for 2 days, then 3 tabs for 2 days, 2 tabs for 2 days, then 1 tab by mouth daily for 2 days 42 tablet Alene Bergerson, PA-C   cyclobenzaprine (FLEXERIL) 10 MG tablet Take 1 tablet (10 mg total) by mouth 2 (two) times daily as needed for muscle spasms. 20 tablet Carmella Kees, Wells Guiles, PA-C      PDMP not reviewed this encounter.   Eulogia Dismore, Wells Guiles, [place] 03/05/22 1705

## 2022-03-05 NOTE — ED Triage Notes (Addendum)
Patient reports that she is having a lupus flare up and can not get an appointment with her doctor until 04/01/22. Patient states she is out of her steroids and other meds that she is prescribed. Patient also reports that she is having bilateral lower extremity edema, "pain all over."  Patient states she last took ibuprofen around 0700 today.

## 2022-03-05 NOTE — Discharge Instructions (Addendum)
Prednisone taper, starting tomorrow morning, as directed on packet.  Muscle relaxer can be taken twice daily.  Please monitor for change in symptoms. With any new or worsening symptoms, go to the emergency department.  Follow up with your primary care provider for follow up.  You can call the [place] office to make an appointment regarding restrictions at work

## 2022-04-01 ENCOUNTER — Encounter: Payer: Self-pay | Admitting: Family Medicine

## 2022-04-01 ENCOUNTER — Ambulatory Visit: Payer: Medicaid Other | Admitting: Family Medicine

## 2022-04-01 VITALS — BP 109/72 | HR 94 | Resp 18 | Ht 66.0 in | Wt 139.0 lb

## 2022-04-01 DIAGNOSIS — F419 Anxiety disorder, unspecified: Secondary | ICD-10-CM

## 2022-04-01 DIAGNOSIS — M329 Systemic lupus erythematosus, unspecified: Secondary | ICD-10-CM | POA: Diagnosis not present

## 2022-04-01 DIAGNOSIS — F331 Major depressive disorder, recurrent, moderate: Secondary | ICD-10-CM

## 2022-04-01 DIAGNOSIS — F5104 Psychophysiologic insomnia: Secondary | ICD-10-CM | POA: Diagnosis not present

## 2022-04-01 MED ORDER — PREDNISONE 10 MG (21) PO TBPK: ORAL_TABLET | Freq: Every day | ORAL | 0 refills | Status: DC

## 2022-04-01 MED ORDER — CYCLOBENZAPRINE HCL 10 MG PO TABS: 10.0000 mg | ORAL_TABLET | Freq: Two times a day (BID) | ORAL | 0 refills | Status: DC | PRN

## 2022-04-01 MED ORDER — MIRTAZAPINE 7.5 MG PO TABS
7.5000 mg | ORAL_TABLET | Freq: Every day | ORAL | 0 refills | Status: DC
Start: 2022-04-01 — End: 2022-04-06

## 2022-04-01 NOTE — Assessment & Plan Note (Signed)
Stress and anxiety has been very high, GAD score of 19 today.  She is seeing family health for therapy, but we discussed medication in conjunction with it given her high level of stress.  Trial of Remeron for mood and sleep.  Will follow-up in 6 weeks.  May consider adding an SSRI if insufficient for mood at that time.

## 2022-04-01 NOTE — Assessment & Plan Note (Addendum)
Given her difficulty both falling asleep and staying asleep, starting low-dose of Remeron 7.5 mg daily at bedtime.  Discussed mechanism of action, safety, importance of taking it with at least 8 hours of sleep to prevent next-day somnolence/sedation.  Will follow-up in 6 weeks to see if there is any improvement and adjust as necessary.  I also told her to reach out sooner if she experiences any side effects that make her need to stop.  Patient expressed understanding and is agreeable to this plan.

## 2022-04-01 NOTE — Progress Notes (Signed)
Established Patient Office Visit  Subjective   Patient ID: Tammy Hale, female    DOB: 1985/07/29  Age: 37 y.o. MRN: [id number]  Chief Complaint  Patient presents with   Medical Management of Chronic Issues   Depression   Lupus   Anorexia    HPI Tammy Hale is a 37 y.o. female presenting today for follow up of mood. Mood: Patient is here to follow up for depression and anxiety.  She tried paroxetine 10 mg daily for about 6 weeks but did not feel that it was effective so she stopped taking it on her own.  Denies mood changes or SI/HI, but does she does say that sometimes she is just "tired of it all". She feels  worse since last visit.  She has been dealing with a lot of stress which is causing insomnia, decreased appetite/weight loss, she has nightmares.  She has had frequent flares of lupus and been to urgent care several times in the last few months.  She has been trying to get herself to eat as well as coping with her symptoms with lupus flares by using marijuana which is helpful, but she knows that this is not good for her and wants something else to work.     04/01/2022   10:42 AM 07/16/2021    2:25 PM 08/24/2017    4:22 PM  GAD 7 : Generalized Anxiety Score  Nervous, Anxious, on Edge 3 1 2  $ Control/stop worrying 3 2 2  $ Worry too much - different things 3 2   Trouble relaxing 3 2 1  $ Restless 1 1 2  $ Easily annoyed or irritable 3 1 2  $ Afraid - awful might happen 3 1 2  $ Total GAD 7 Score 19 10   Anxiety Difficulty Very difficult Very difficult         04/01/2022   10:42 AM 07/16/2021    2:25 PM 08/24/2017    4:22 PM  Depression screen PHQ 2/9  Decreased Interest 3 1 2  $ Down, Depressed, Hopeless 3 1 2  $ PHQ - 2 Score 6 2 4  $ Altered sleeping 3 2 1  $ Tired, decreased energy 3 2 2  $ Change in appetite 3 2 2  $ Feeling bad or failure about yourself  3 2 2  $ Trouble concentrating 3 1 3  $ Moving slowly or fidgety/restless 0 2 2  Suicidal thoughts 0 1 2  PHQ-9 Score 21 14 18   $ Difficult doing work/chores Very difficult Very difficult       ROS Negative unless otherwise noted in HPI   Objective:     BP 109/72 (BP Location: Left Arm, Patient Position: Sitting, Cuff Size: Normal)   Pulse 94   Resp 18   Ht 5' 6"$  (1.676 m)   Wt 139 lb (63 kg)   SpO2 99%   BMI 22.44 kg/m   Physical Exam Constitutional:      General: She is not in acute distress.    Appearance: Normal appearance.  HENT:     Head: Normocephalic and atraumatic.  Cardiovascular:     Rate and Rhythm: Normal rate and regular rhythm.     Pulses: Normal pulses.     Heart sounds: No murmur heard.    No friction rub. No gallop.  Pulmonary:     Effort: Pulmonary effort is normal. No respiratory distress.     Breath sounds: No wheezing, rhonchi or rales.  Skin:    General: Skin is warm and dry.  Neurological:  Mental Status: She is alert and oriented to person, place, and time.       Assessment & Plan:  Moderate episode of recurrent major depressive disorder [facility]) Assessment & Plan: Mood has been very low, PHQ-9 score of 21 which has been making things very difficult.  We discussed several options for treatment including changing to a different SSRI, SNRI, or Remeron along with the mechanisms of action and use of each.  She thought Remeron was a great option to help her to sleep better which hopefully will improve her mood and appetite as well.  Sent in low-dose of Remeron, provided instructions to take it before bed and ensure that she gets at least 8 hours of sleep after taking it to prevent next-day somnolence/sedation.  Patient expressed understanding.  We will follow-up in 6 weeks to see how the Remeron is helping with both sleep and mood, may consider adding on Lexapro, Zoloft, or Prozac if it is insufficient for mood symptoms.  Orders: -     Mirtazapine; Take 1 tablet (7.5 mg total) by mouth at bedtime. Make sure you will get at least 8 hours of sleep after taking it to avoid  next-day sedation.  Dispense: 60 tablet; Refill: 0  Anxiety Assessment & Plan: Stress and anxiety has been very high, GAD score of 19 today.  She is seeing family health for therapy, but we discussed medication in conjunction with it given her high level of stress.  Trial of Remeron for mood and sleep.  Will follow-up in 6 weeks.  May consider adding an SSRI if insufficient for mood at that time.  Orders: -     Mirtazapine; Take 1 tablet (7.5 mg total) by mouth at bedtime. Make sure you will get at least 8 hours of sleep after taking it to avoid next-day sedation.  Dispense: 60 tablet; Refill: 0  Lupus ([place]) Assessment & Plan: She has not been on any maintenance medication and ran out of her prednisone taper which previously was enough to manage her symptoms.  We discussed seeing a rheumatologist for management of lupus since it does not seem that she has ever been evaluated by a specialist.  She is agreeable to this plan.  She is also open to a maintenance medication different from the prednisone and cyclobenzaprine that she has been on for several years now.  Ambulatory referral to rheumatology may need, I did refill her medications in case she does have any flares before she is able to see rheumatology.  Orders: -     Ambulatory referral to Rheumatology -     Cyclobenzaprine HCl; Take 1 tablet (10 mg total) by mouth 2 (two) times daily as needed for muscle spasms.  Dispense: 20 tablet; Refill: 0 -     predniSONE; Take by mouth daily. Take 6 tabs by mouth daily for 2 days, then 5 tabs for 2 days, then 4 tabs for 2 days, then 3 tabs for 2 days, 2 tabs for 2 days, then 1 tab by mouth daily for 2 days  Dispense: 42 tablet; Refill: 0  Psychophysiological insomnia Assessment & Plan: Given her difficulty both falling asleep and staying asleep, starting low-dose of Remeron 7.5 mg daily at bedtime.  Discussed mechanism of action, safety, importance of taking it with at least 8 hours of sleep to prevent  next-day somnolence/sedation.  Will follow-up in 6 weeks to see if there is any improvement and adjust as necessary.  I also told her to reach out sooner if  she experiences any side effects that make her need to stop.  Patient expressed understanding and is agreeable to this plan.  Orders: -     Mirtazapine; Take 1 tablet (7.5 mg total) by mouth at bedtime. Make sure you will get at least 8 hours of sleep after taking it to avoid next-day sedation.  Dispense: 60 tablet; Refill: 0    Return in about 6 weeks (around 05/13/2022) for follow-up mood.    Velva Harman, PA

## 2022-04-01 NOTE — Assessment & Plan Note (Signed)
Mood has been very low, PHQ-9 score of 21 which has been making things very difficult.  We discussed several options for treatment including changing to a different SSRI, SNRI, or Remeron along with the mechanisms of action and use of each.  She thought Remeron was a great option to help her to sleep better which hopefully will improve her mood and appetite as well.  Sent in low-dose of Remeron, provided instructions to take it before bed and ensure that she gets at least 8 hours of sleep after taking it to prevent next-day somnolence/sedation.  Patient expressed understanding.  We will follow-up in 6 weeks to see how the Remeron is helping with both sleep and mood, may consider adding on Lexapro, Zoloft, or Prozac if it is insufficient for mood symptoms.

## 2022-04-01 NOTE — Assessment & Plan Note (Signed)
She has not been on any maintenance medication and ran out of her prednisone taper which previously was enough to manage her symptoms.  We discussed seeing a rheumatologist for management of lupus since it does not seem that she has ever been evaluated by a specialist.  She is agreeable to this plan.  She is also open to a maintenance medication different from the prednisone and cyclobenzaprine that she has been on for several years now.  Ambulatory referral to rheumatology may need, I did refill her medications in case she does have any flares before she is able to see rheumatology.

## 2022-04-01 NOTE — Patient Instructions (Signed)
Reasons why it is important to allow yourself to process and experience true feelings: When you numb sadness, you also numb happiness and joy. Struggling with your emotions often leads to more suffering. Processing and experiencing your feelings is a part of having a full life.    Coping skills are things we can do to make ourselves feel better when we are going through difficult times. Examples of coping skills include:  Take a deep breath Count to 20 Listen to music Call a friend Take a walk Read a book Do a puzzle Meditate Journal Exercise Stretch [place] outside [place] a note Take a bath Watch a movie Pet an animal Visit a friend Be alone in a quiet place   When your anxiety gets worse, try these grounding techniques:      If you need additional help, please contact your primary care provider or call one of the resources listed below:  [place] at [phone] or [phone] [place], [place] [address]  [facility] 800-SUICIDE or [place] or [phone]  [facility] https://www.celebraterecovery.com/   I'm so proud of you for asking for help, you've got this! I'm here for you whenever you need it.

## 2022-04-05 ENCOUNTER — Encounter: Payer: Self-pay | Admitting: Family Medicine

## 2022-04-05 ENCOUNTER — Telehealth: Payer: Self-pay

## 2022-04-05 DIAGNOSIS — F331 Major depressive disorder, recurrent, moderate: Secondary | ICD-10-CM

## 2022-04-05 DIAGNOSIS — F419 Anxiety disorder, unspecified: Secondary | ICD-10-CM

## 2022-04-05 DIAGNOSIS — F5104 Psychophysiologic insomnia: Secondary | ICD-10-CM

## 2022-04-05 DIAGNOSIS — M329 Systemic lupus erythematosus, unspecified: Secondary | ICD-10-CM

## 2022-04-05 NOTE — Telephone Encounter (Signed)
LVM requesting a return call.

## 2022-04-05 NOTE — Telephone Encounter (Signed)
Pt called stating the pharmacy advised her that insurance is not covering medications that were prescribed  04/01/22. Pt is requesting the nurse to call and speak to the pharmacy to see what she can do.   Pt number is [phone] if you need to speak to her.  Pt may need a different medication for insurance purposes

## 2022-04-05 NOTE — Telephone Encounter (Signed)
Called [facility] to verify which medication needed a PA and person who answered the phone stated that the store is closing tomorrow and are unable to to unable to see which one.

## 2022-04-06 ENCOUNTER — Ambulatory Visit (HOSPITAL_COMMUNITY)
Admission: EM | Admit: 2022-04-06 | Discharge: 2022-04-06 | Disposition: A | Discharge status: Home / Self Care | Payer: Medicaid Other | Attending: Psychiatry | Admitting: Psychiatry

## 2022-04-06 DIAGNOSIS — F419 Anxiety disorder, unspecified: Secondary | ICD-10-CM | POA: Insufficient documentation

## 2022-04-06 DIAGNOSIS — R630 Anorexia: Secondary | ICD-10-CM | POA: Insufficient documentation

## 2022-04-06 DIAGNOSIS — F32A Depression, unspecified: Secondary | ICD-10-CM | POA: Insufficient documentation

## 2022-04-06 MED ORDER — MIRTAZAPINE 7.5 MG PO TABS: 7.5000 mg | ORAL_TABLET | Freq: Every day | ORAL | 1 refills | Status: DC

## 2022-04-06 NOTE — Addendum Note (Signed)
Addended by: Virgil Benedict on: 04/06/2022 09:58 AM   Modules accepted: Orders
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
## 2022-04-06 NOTE — Discharge Instructions (Addendum)
Please come to [facility] (this facility, SECOND FLOOR) during walk in hours for appointment with psychiatrist/provider for further medication management and for therapists for therapy.   Walk in hours for therapy/counseling: Monday through Thursday 7:30AM until slots are full. Every Friday 12PM until slots are full.  Walk in hours for psychiatry/medication management: Monday through Friday 7:30AM until slots are full.   When you arrive please take the elevators upstairs. If you are unsure of where to go, inform the front desk that you are here for OUTPATIENT open access hours and they will assist you with directions upstairs.  Walk ins spots are limited and are seen first come, first served. YOU MAY NOT BE SEEN THE SAME DAY YOU ARRIVE. To increase the likelihood of being seen the same day, please arrive early, such as by 7:00AM.  Address:  [place] [address]), in [place], [place] Ph: [phone]   It is imperative that you follow through with treatment recommendations within 5-7 days from the day of discharge to mitigate further risk to your safety and overall mental well-being.  A list of outpatient therapy and psychiatric providers for medication management has been provided below to get you started in finding the right provider for you.            [facility] [address] [place], [place], [address] [phone] phone (Medicare, Private insurance except [place], [place], and [facility])  [place] [address] [place], [place], [address] [phone] phone ([place], [place], [place], [place], Friday Health Plans, [place], [place], [place], [place], [place], [place], [place], [place], [facility], [employer], [employer])  [employer] [phone] [address], [place], [place],  [address] [phone] phone [phone] phone [phone] fax  [facility] [address] [place], [place], [address] [phone] phone (Call to confirm insurance coverage) Consultation & Support Services     o Drop-In Hours: 1:00 PM to 5:00 PM     o Days: Monday - Thursday  Crisis Services (24/7)   [facility] [address], [place], [place], [address] [phone] phone ([address] [place], [place], [place] Medicaid, [place] and [place], [facility])      [place] [address], [place], [place], [address] [phone] phone [email or web address]  (to complete the intake form and upload ID and insurance cards)  [place] [place], [place], [address] [phone] phone ([place], [place], [place], [place], [place], [place], [facility], [place], and certain Medicaid plans)  [place] [place], [place], [address] [phone] phone [phone] fax (Medicaid, Medicare, Self-pay, call about other insurance coverage)  [facility] (age 28+) [address] [place], [place], [address] [phone] hone [phone] fax ([place], MedCost, [place], [place], [place], [place], [place], [place], [place], certain Medicare providers, [facility], [place])  [place], [place], [address] [phone] phone (Medicare, Medicaid, [place] Dinning, call about other insurance coverage)  [place] [address] [place], [place], [address] [phone] phone [phone] fax (Call [phone] to see what insurance is accepted) Norma Fredrickson, MD specializes in geropsych)  [facility], [facility]  (medication management only) [address], [place], [place], [address] [phone]  phone [phone] fax ([address], Medicaid, [place], [place], [place], [place], [place],  [place], [place])  [facility] (medication management only) [address] [place], [place], [address] [phone] phone [phone] fax ([address], Medicare, [place], [place], [place])  [facility] [address] [place], [place], [address] [phone] phone [phone] fax ([address], [place], [place], [place], [place], [facility], [place])  [address] [place], [place], [address] [phone] phone [phone] fax (Medicare, Medicaid, [facility])  [place], [place] [address] [phone] phone ([address] [place], [place], [place], [place], [place], [place], [facility]) Does genetic testing for medications; does transcranial magnetic stimulation along with basic services)  [facility] [address] [place], [place], [address] [phone] phone (Call about insurance coverage)  [facility] [address] [place], [place], [address] [phone] phone [phone] fax (Call about insurance coverage)  [place] [address] [place], [place], [address] [phone] phone [phone] fax (Call about insurance coverage)  [employer] [phone] [address] [place], [address] [phone] phone (Medicaid, [place], [place], [place], [place])  [employer] [address] [place], [place], [address] [phone] phone (Medicaid, Medicare, call about other insurance coverage)  [facility] [address] [place], [place], [address] [phone] phone [phone] fax (Medicaid, Medicare, Tricare, call about other insurance coverage)  [facility] [place] [place], [place], [address] [phone] phone ([address] [place], [place], [place], [place], [place]  types - Alliance, Stage manager, Partners, [place], [place] Choice, Healthy [place], [place], [facility], and Complete)  [place] [id number] [address] [place], [place], [address] [phone] phone Completely online treatment platform Contact: [place] Specialist [phone] phone [phone] fax ([address], [place], [place], Friday Health Plan, Humana, [place], [place], [place], [place], [place])

## 2022-04-08 MED ORDER — MIRTAZAPINE 7.5 MG PO TABS: 7.5000 mg | ORAL_TABLET | Freq: Every day | ORAL | 1 refills | Status: DC

## 2022-04-12 MED ORDER — PREDNISONE 10 MG (21) PO TBPK: ORAL_TABLET | Freq: Every day | ORAL | 0 refills | Status: DC

## 2022-04-12 MED ORDER — VALACYCLOVIR HCL 1 G PO TABS: 500.0000 mg | ORAL_TABLET | Freq: Two times a day (BID) | ORAL | 0 refills | Status: DC

## 2022-04-12 MED ORDER — CYCLOBENZAPRINE HCL 10 MG PO TABS: 10.0000 mg | ORAL_TABLET | Freq: Two times a day (BID) | ORAL | 0 refills | Status: DC | PRN

## 2022-04-12 NOTE — Addendum Note (Signed)
Addended by: Gemma Payor on: 04/12/2022 08:57 AM   Modules accepted: Orders

## 2022-04-13 ENCOUNTER — Other Ambulatory Visit: Payer: Self-pay | Admitting: Family Medicine

## 2022-04-13 DIAGNOSIS — M329 Systemic lupus erythematosus, unspecified: Secondary | ICD-10-CM

## 2022-04-13 DIAGNOSIS — A6004 Herpesviral vulvovaginitis: Secondary | ICD-10-CM

## 2022-04-14 NOTE — Telephone Encounter (Signed)
Prescriptions were reordered under Dr. Suzi Roots

## 2022-04-21 ENCOUNTER — Encounter (HOSPITAL_COMMUNITY): Payer: Self-pay

## 2022-04-21 ENCOUNTER — Ambulatory Visit (INDEPENDENT_AMBULATORY_CARE_PROVIDER_SITE_OTHER): Payer: Medicaid Other | Admitting: Clinical

## 2022-04-21 DIAGNOSIS — F331 Major depressive disorder, recurrent, moderate: Secondary | ICD-10-CM
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
## 2022-04-26 ENCOUNTER — Other Ambulatory Visit: Payer: Self-pay | Admitting: Family Medicine

## 2022-04-26 ENCOUNTER — Telehealth: Payer: Self-pay

## 2022-04-26 DIAGNOSIS — A6004 Herpesviral vulvovaginitis: Secondary | ICD-10-CM

## 2022-04-26 DIAGNOSIS — M329 Systemic lupus erythematosus, unspecified: Secondary | ICD-10-CM

## 2022-04-26 DIAGNOSIS — F5104 Psychophysiologic insomnia: Secondary | ICD-10-CM

## 2022-04-26 DIAGNOSIS — F331 Major depressive disorder, recurrent, moderate: Secondary | ICD-10-CM

## 2022-04-26 DIAGNOSIS — F419 Anxiety disorder, unspecified: Secondary | ICD-10-CM

## 2022-04-26 MED ORDER — CYCLOBENZAPRINE HCL 10 MG PO TABS: ORAL_TABLET | ORAL | 0 refills | Status: DC

## 2022-04-26 MED ORDER — PREDNISONE 10 MG PO TABS: ORAL_TABLET | ORAL | 0 refills | Status: DC

## 2022-04-26 MED ORDER — VALACYCLOVIR HCL 1 G PO TABS: 500.0000 mg | ORAL_TABLET | Freq: Two times a day (BID) | ORAL | 0 refills | Status: DC

## 2022-04-26 MED ORDER — MIRTAZAPINE 7.5 MG PO TABS: 7.5000 mg | ORAL_TABLET | Freq: Every day | ORAL | 1 refills | Status: DC

## 2022-04-26 NOTE — Telephone Encounter (Signed)
Patient called office about her pharmacy not getting her medication refills on her FLEXERIL, DELTASONE, [place], please advise, thanks.

## 2022-05-24 ENCOUNTER — Ambulatory Visit: Payer: Medicaid Other | Admitting: Family Medicine

## 2022-05-24 ENCOUNTER — Ambulatory Visit (INDEPENDENT_AMBULATORY_CARE_PROVIDER_SITE_OTHER): Payer: Medicaid Other | Admitting: Clinical

## 2022-05-24 ENCOUNTER — Encounter: Payer: Self-pay | Admitting: Family Medicine

## 2022-05-24 DIAGNOSIS — F331 Major depressive disorder, recurrent, moderate: Secondary | ICD-10-CM | POA: Diagnosis not present

## 2022-05-24 DIAGNOSIS — F515 Nightmare disorder: Secondary | ICD-10-CM

## 2022-05-24 DIAGNOSIS — B3731 Acute candidiasis of vulva and vagina: Secondary | ICD-10-CM

## 2022-05-24 DIAGNOSIS — F419 Anxiety disorder, unspecified: Secondary | ICD-10-CM

## 2022-05-24 DIAGNOSIS — F5104 Psychophysiologic insomnia: Secondary | ICD-10-CM

## 2022-05-24 MED ORDER — SERTRALINE HCL 25 MG PO TABS: 25.0000 mg | ORAL_TABLET | Freq: Every day | ORAL | 2 refills | Status: DC

## 2022-05-24 MED ORDER — MIRTAZAPINE 15 MG PO TABS: 15.0000 mg | ORAL_TABLET | Freq: Every day | ORAL | 2 refills | Status: DC

## 2022-05-24 MED ORDER — FLUCONAZOLE 150 MG PO TABS: 150.0000 mg | ORAL_TABLET | Freq: Once | ORAL | 0 refills | Status: AC

## 2022-05-24 MED ORDER — PRAZOSIN HCL 1 MG PO CAPS: 1.0000 mg | ORAL_CAPSULE | Freq: Every day | ORAL | 3 refills | Status: DC

## 2022-05-24 NOTE — Assessment & Plan Note (Signed)
Patient describes nightmares related to past trauma, trial of prazosin if related to PTSD.  We discussed that nightmares may also improve as mood is better controlled.  Patient is agreeable to trial of prazosin 1 mg nightly and would like to continue mirtazapine 15 mg nightly for sleep as well.

## 2022-05-24 NOTE — Assessment & Plan Note (Addendum)
PHQ-9 score of 22, increased from last appointment.  We discussed medication options including SSRIs and SNRIs.  Patient has used Zoloft and Celexa in the past with success and would like to try restarting Zoloft.  Sent in prescription for Zoloft 25 mg daily.  Also recommended to continue mirtazapine 15 mg nightly to help with sleep as well as depression.  In the future, if Zoloft is helpful there is the option to increase it or add a second generation antipsychotic if necessary.  Encouraged her to continue with therapy in combination with medication as this is the most effective treatment.

## 2022-05-24 NOTE — Assessment & Plan Note (Signed)
GAD-7 score of 16.  Recommend starting Zoloft 25 mg daily, continue mirtazapine 15 mg nightly.  Encouraged to continue work with therapist and provided referral to psychiatrist for further management.

## 2022-05-24 NOTE — Progress Notes (Signed)
Established Patient Office Visit  Subjective   Patient ID: Tammy Hale, female    DOB: May 28, 1985  Age: 37 y.o. MRN: [id number]  Chief Complaint  Patient presents with   Anxiety   Depression    HPI Tammy Hale is a 37 y.o. female presenting today for follow up of mood. Mood: Patient is here to follow up for anxiety and depression, at last appointment decided on trial of mirtazapine 7.5 mg nightly before bed. Taking medication without side effects, reports excellent compliance with treatment, she has been taking 2 tablets for total of 15 mg of mirtazapine nightly before bed. Denies mood changes or SI/HI. She feels mood is worse since last visit.  The mirtazapine has helped her to be able to sleep, but she is still having nightmares and wakes up in a sweat frequently.  She states these nightmares are related to prior traumatic events, but she is continue to take the mirtazapine because it has been helping so much with sleep.  She is scheduled to see a therapist later this afternoon which she thinks will be helpful, but mood has still been very unstable due to stressors with finances, custody battle, and unemployment. Denies chest pain, difficulty concentrating, dizziness, fatigue, insomnia, irritability, palpitations, panic attacks, racing thoughts, SOB, sweating. Denies anhedonia, depressed mood, difficulty concentrating, fatigue, feelings of worthlessness/guilt, hopelessness, hypersomnia, impaired memory, insomnia, psychomotor agitation, psychomotor retardation, recurrent thoughts of death, weight changes.     Jun 09, 2022   11:13 AM 04/21/2022   10:31 AM 04/01/2022   10:42 AM  Depression screen PHQ 2/9  Decreased Interest Down, Depressed, Hopeless PHQ - 2 Score Altered sleeping Tired, decreased energy Change in appetite Feeling bad or failure about yourself  Trouble concentrating Moving slowly or fidgety/restless 3 0 0   Suicidal thoughts 0 0 0  PHQ-9 Score Difficult doing work/chores Somewhat difficult Very difficult Very difficult       2022/06/09   11:13 AM 04/21/2022   10:31 AM 04/01/2022   10:42 AM 07/16/2021    2:25 PM  GAD 7 : Generalized Anxiety Score  Nervous, Anxious, on Edge Control/stop worrying Worry too much - different things Trouble relaxing Restless Easily annoyed or irritable Afraid - awful might happen Total GAD 7 Score Anxiety Difficulty Somewhat difficult Very difficult Very difficult Very difficult   ROS Negative unless otherwise noted in HPI   Objective:     BP 126/89 (BP Location: Left Arm, Patient Position: Sitting, Cuff Size: Normal)   Pulse (!) 106   Resp 18   Ht  (1.676 m)   Wt 142 lb (64.4 kg)   SpO2 98%   BMI 22.92 kg/m   Physical Exam Constitutional:      General: She is not in acute distress.    Appearance: Normal appearance.  HENT:     Head: Normocephalic and atraumatic.  Pulmonary:     Effort: Pulmonary effort is normal. No respiratory distress.  Musculoskeletal:     Cervical back: Normal range of motion.  Neurological:     General: No focal deficit  present.     Mental Status: She is alert and oriented to person, place, and time. Mental status is at baseline.  Psychiatric:        Attention and Perception: Attention and perception normal.        Mood and Affect: Mood is anxious. Affect is tearful.        Speech: Speech normal.        Behavior: Behavior normal.        Thought Content: Thought content normal.        Cognition and Memory: Cognition normal.        Judgment: Judgment normal.     Assessment & Plan:  Moderate episode of recurrent major depressive disorder Assessment & Plan: PHQ-9 score of 22, increased from last appointment.  We discussed medication options including SSRIs and SNRIs.  Patient has used Zoloft and Celexa in the past with success  and would like to try restarting Zoloft.  Sent in prescription for Zoloft 25 mg daily.  Also recommended to continue mirtazapine 15 mg nightly to help with sleep as well as depression.  In the future, if Zoloft is helpful there is the option to increase it or add a second generation antipsychotic if necessary.  Encouraged her to continue with therapy in combination with medication as this is the most effective treatment.  Orders: -     Sertraline HCl; Take 1 tablet (25 mg total) by mouth daily.  Dispense: 30 tablet; Refill: 2 -     Ambulatory referral to Psychiatry -     Mirtazapine; Take 1 tablet (15 mg total) by mouth at bedtime.  Dispense: 30 tablet; Refill: 2  Anxiety Assessment & Plan: GAD-7 score of 16.  Recommend starting Zoloft 25 mg daily, continue mirtazapine 15 mg nightly.  Encouraged to continue work with therapist and provided referral to psychiatrist for further management.  Orders: -     Sertraline HCl; Take 1 tablet (25 mg total) by mouth daily.  Dispense: 30 tablet; Refill: 2 -     Ambulatory referral to Psychiatry  Nightmares Assessment & Plan: Patient describes nightmares related to past trauma, trial of prazosin if related to PTSD.  We discussed that nightmares may also improve as mood is better controlled.  Patient is agreeable to trial of prazosin 1 mg nightly and would like to continue mirtazapine 15 mg nightly for sleep as well.  Orders: -     Prazosin HCl; Take 1 capsule (1 mg total) by mouth at bedtime. After 2 to 3 days increase dose to 2 mg at bedtime  Dispense: 30 capsule; Refill: 3 -     Ambulatory referral to Psychiatry  Psychophysiological insomnia Assessment & Plan: Continue mirtazapine 15 mg nightly for sleep and mood.  Will continue to monitor.  At next appointment we will collect CBC, CMP, and testing to confirm lupus diagnosis.  Orders: -     Mirtazapine; Take 1 tablet (15 mg total) by mouth at bedtime.  Dispense: 30 tablet; Refill: 2  Vulvovaginal  candidiasis -     Fluconazole; Take 1 tablet (150 mg total) by mouth once for 1 dose.  Dispense: 1 tablet; Refill: 0  Patient has also been experiencing vulvovaginal itching and mild abdominal pain shortly after stopping her period.  She declined testing today but would like to try fluconazole as she believes that it is similar to yeast infections that she has had in the past.  Return in about 6 weeks (around 07/05/2022) for follow-up for mood  and sleep.   I spent 35 minutes on the day of the encounter to include pre-visit record review, face-to-face time with the patient collecting history, discussing treatment options, and providing counseling on current mood symptoms.   Melida Quitter, PA

## 2022-05-24 NOTE — Assessment & Plan Note (Signed)
PHQ-9 score of 22, increased from last appointment.  We discussed medication options including SSRIs and SNRIs.  Patient has used Zoloft and Celexa in the past with success and would like to try restarting Zoloft.  Sent in prescription for Zoloft 25 mg daily.  Also recommended to continue mirtazapine 15 mg nightly to help with sleep as well as depression.  In the future, if Zoloft is helpful there is the option to increase it or add a second generation antipsychotic if necessary.  Encouraged her to continue with therapy in combination with medication as this is the most effective treatment. 

## 2022-05-24 NOTE — Patient Instructions (Addendum)
Continue mirtazapine 15 mg nightly at bedtime.  Start Zoloft 25 mg daily.  Start prazosin 1 mg nightly at bedtime.  After 2 to 3 days if tolerating well, can increase to 2 mg nightly at bedtime.

## 2022-05-24 NOTE — Assessment & Plan Note (Addendum)
Continue mirtazapine 15 mg nightly for sleep and mood.  Will continue to monitor.  At next appointment we will collect CBC, CMP, and testing to confirm lupus diagnosis.

## 2022-05-28 NOTE — Progress Notes (Signed)
THERAPIST PROGRESS NOTE Virtual Visit via Video Note  I connected with [name] on 05/24/2022 at  4:00 PM EDT by a video enabled telemedicine application and verified that I am speaking with the correct person using two identifiers.  Location: Patient: home Provider: office   I discussed the limitations of evaluation and management by telemedicine and the availability of in person appointments. The patient expressed understanding and agreed to proceed.   Follow Up Instructions: I discussed the assessment and treatment plan with the patient. The patient was provided an opportunity to ask questions and all were answered. The patient agreed with the plan and demonstrated an understanding of the instructions.   The patient was advised to call back or seek an in-person evaluation if the symptoms worsen or if the condition fails to improve as anticipated.   Session Time: 25 minutes  Participation Level: Active  Behavioral Response: CasualAlertDepressed  Type of Therapy: Individual Therapy  Treatment Goals addressed: client will practice behavioral activation skills 3 times per week for the next 12 weeks  ProgressTowards Goals: Progressing  Interventions: CBT and Supportive  Summary:  Keasia Dubose is a 37 y.o. female who presents for the scheduled appointment oriented times five, appropriately dressed, and friendly. Client denied hallucinations and delusions. Client reported on today she is not feeling well. Client reported she was diagnosed with the flu and is at home trying to feel better. Client reported she had her first zoom session for court related to her daughters. Client reported it was just to assign the case and establish representation. Client reported she has been worried about how to pay for lawyer for her case and being bale to travel up there for following cases. Client reported she has been applying to jobs but because of her record she is not getting call backs.  Client reported going through this stress has made her miss her grandmother. Client reported she feels alone in working through the process but she is not going to give up she wants her daughters to be home with her. Client reported she found out through her lawyer that her friend tried to do a secret adoption which was denied. Client reported one of her daughters father is not at all supportive of her trying to get custody back. Client reported otherwise she is going to keep putting in applications until a job comes through. Evidence of progress towards goal:  client reported 1 positive cognitive pattern to help her work through a stressful legal situation.    Suicidal/Homicidal: Nowithout intent/plan  Therapist Response:  Therapist began the appointment asking the client how she has been doing. Therapist used CBT to engage using active listening and positive emotional support. Therapist used CBT to engage and give the client time to discuss her thoughts and feelings pertaining to legal, family and employment issues. Therapist used CBT to normalize the clients emotional response. Therapist used CBT to engage and discuss using external motivation factors to help her continue with solving employment, financial and emotional distress. Therapist used CBT ask the client to identify her progress with frequency of use with coping skills with continued practice in her daily activity.    Therapist assigned the client homework to practice self care.    Plan: Return again in 3 weeks.  Diagnosis: major depressive disorder, recurrent episode ,moderate with anxious distress  Collaboration of Care: Patient refused AEB none requested by the client.  Patient/Guardian was advised Release of Information must be obtained prior to any record release  in order to collaborate their care with an outside provider. Patient/Guardian was advised if they have not already done so to contact the registration department to  sign all necessary forms in order for [place] to release information regarding their care.   Consent: Patient/Guardian gives verbal consent for treatment and assignment of benefits for services provided during this visit. Patient/Guardian expressed understanding and agreed to proceed.   Neena Rhymes Kaylene Dawn, LCSW 05/24/2022

## 2022-06-08 ENCOUNTER — Encounter (HOSPITAL_COMMUNITY): Payer: Self-pay

## 2022-06-08 ENCOUNTER — Ambulatory Visit (HOSPITAL_COMMUNITY): Payer: Medicaid Other | Admitting: Clinical

## 2022-07-06 ENCOUNTER — Ambulatory Visit: Payer: Medicaid Other | Admitting: Family Medicine

## 2022-07-06 NOTE — Progress Notes (Deleted)
   Established Patient Office Visit  Subjective   Patient ID: Tammy Hale, female    DOB: 1985/05/09  Age: 37 y.o. MRN: [id number]  No chief complaint on file.   HPI Tammy Hale is a 37 y.o. female presenting today for follow up of mood. Mood: Patient is here to follow up for ***, currently managing with ***. Taking medication without side effects, reports {excellent/good/fair/poor:19665} compliance with treatment. Denies mood changes or SI/HI. She feels mood is {improved/worse/stable:29130} since last visit. Denies chest pain, difficulty concentrating, dizziness, fatigue, insomnia, irritability, palpitations, panic attacks, racing thoughts, SOB, sweating. Denies anhedonia, depressed mood, difficulty concentrating, fatigue, feelings of worthlessness/guilt, hopelessness, hypersomnia, impaired memory, insomnia, psychomotor agitation, psychomotor retardation, recurrent thoughts of death, weight changes.     06/06/22   11:13 AM 04/21/2022   10:31 AM 04/01/2022   10:42 AM  Depression screen PHQ 2/9  Decreased Interest 2  3  Down, Depressed, Hopeless 2  3  PHQ - 2 Score 4  6  Altered sleeping 3  3  Tired, decreased energy 3  3  Change in appetite 3  3  Feeling bad or failure about yourself  3  3  Trouble concentrating 3  3  Moving slowly or fidgety/restless 3  0  Suicidal thoughts 0  0  PHQ-9 Score 22  21  Difficult doing work/chores Somewhat difficult  Very difficult     Information is confidential and restricted. Go to Review Flowsheets to unlock data.       06-06-22   11:13 AM 04/21/2022   10:31 AM 04/01/2022   10:42 AM 07/16/2021    2:25 PM  GAD 7 : Generalized Anxiety Score  Nervous, Anxious, on Edge 2  3 1   Control/stop worrying 3  3 2   Worry too much - different things 3  3 2   Trouble relaxing 3  3 2   Restless 1  1 1   Easily annoyed or irritable 2  3 1   Afraid - awful might happen 2  3 1   Total GAD 7 Score 16  19 10   Anxiety Difficulty Somewhat difficult  Very  difficult Very difficult     Information is confidential and restricted. Go to Review Flowsheets to unlock data.     ROS Negative unless otherwise noted in HPI   Objective:     There were no vitals taken for this visit.  Physical Exam   No results found for any visits on 07/06/22.   Assessment & Plan:  There are no diagnoses linked to this encounter.  No follow-ups on file.    Melida Quitter, PA

## 2022-07-20 ENCOUNTER — Telehealth (HOSPITAL_COMMUNITY): Payer: Self-pay | Admitting: Licensed Clinical Social Worker

## 2022-07-20 NOTE — Telephone Encounter (Signed)
The therapist responds to a Secure email from Mr. Tammy Hale with [facility] with the following:  "Ms. Tammy Hale saw Ms. Tammy Cozart, LCSW for individual therapy on 04/22/22 and 05/24/22. For reasons unknown, she left without being seen on 06/08/22 but is schedule to see Ms. Hale again on 07/30/22."   Myrna Blazer, MA, LCSW, [facility], LCAS 07/20/2022

## 2022-07-30 ENCOUNTER — Ambulatory Visit (INDEPENDENT_AMBULATORY_CARE_PROVIDER_SITE_OTHER): Payer: Medicaid Other | Admitting: Clinical

## 2022-07-30 ENCOUNTER — Encounter (HOSPITAL_COMMUNITY): Payer: Self-pay

## 2022-07-30 ENCOUNTER — Ambulatory Visit (HOSPITAL_COMMUNITY)
Admission: RE | Admit: 2022-07-30 | Discharge: 2022-07-30 | Disposition: A | Discharge status: Home / Self Care | Payer: Medicaid Other | Source: Ambulatory Visit | Attending: Internal Medicine

## 2022-07-30 VITALS — BP 105/69 | HR 91 | Temp 98.3°F | Resp 18

## 2022-07-30 DIAGNOSIS — F331 Major depressive disorder, recurrent, moderate: Secondary | ICD-10-CM | POA: Diagnosis not present

## 2022-07-30 DIAGNOSIS — M329 Systemic lupus erythematosus, unspecified: Secondary | ICD-10-CM | POA: Diagnosis not present

## 2022-07-30 DIAGNOSIS — S46911A Strain of unspecified muscle, fascia and tendon at shoulder and upper arm level, right arm, initial encounter: Secondary | ICD-10-CM | POA: Diagnosis not present

## 2022-07-30 MED ORDER — CYCLOBENZAPRINE HCL 10 MG PO TABS: 10.0000 mg | ORAL_TABLET | Freq: Two times a day (BID) | ORAL | 0 refills | Status: DC | PRN

## 2022-07-30 MED ORDER — PREDNISONE 20 MG PO TABS: ORAL_TABLET | ORAL | 0 refills | Status: AC

## 2022-07-30 NOTE — Progress Notes (Signed)
THERAPIST PROGRESS NOTE Virtual Visit via Video Note  I connected with Tammy Hale on 07/30/2022 at 11:00 AM EDT by a video enabled telemedicine application and verified that I am speaking with the correct person using two identifiers.  Location: Patient: home Provider: office   I discussed the limitations of evaluation and management by telemedicine and the availability of in person appointments. The patient expressed understanding and agreed to proceed.  Follow Up Instructions: I discussed the assessment and treatment plan with the patient. The patient was provided an opportunity to ask questions and all were answered. The patient agreed with the plan and demonstrated an understanding of the instructions.   The patient was advised to call back or seek an in-person evaluation if the symptoms worsen or if the condition fails to improve as anticipated.    Session Time: 30 minutes  Participation Level: Active  Behavioral Response: CasualAlertEuthymic  Type of Therapy: Individual Therapy  Treatment Goals addressed: client will practice behavioral activation skills 3 times per week for the next 12 weeks  ProgressTowards Goals: Progressing  Interventions: CBT and Supportive  Summary:  Tammy Hale is a 37 y.o. female who presents for the scheduled appointment oriented times five, appropriately dressed and friendly. Client denied hallucinations and delusions. Client reported on today she is doing better. Client reported she is dealing with stress of the custody battle with her girls. Client reported she has a Clinical research associate. Client reported she has her lawyer on the case. Client reported otherwise she now has 2 jobs working at [employer] and [employer] Client reported she able to catch up on her bills. Client reported her probation officer told her she would be taken off of probation next month. Client reported her mother has also reached out to her. Client reported she speaks to her mother but  does not tell her details about her life anymore because she is not a positive support person. Client reported she has been going to church and realigned her focus on her relationship with God and the wellbeing of her family. Client reported she has been gaining back some weight and feeling happier. Client reported she does not have nightmares anymore either.  Evidence of progress towards goal:  client reported 2 goal accomplishments of obtaining employment and enforcing boundaries.   Suicidal/Homicidal: Nowithout intent/plan  Therapist Response:  Therapist began the appointment asking the client how she has been doing. Therapist used cbt to engage using active listening and positive emotional support. Therapist used cbt to engage and ask the client about severity of her emotional symptoms and the cause. Therapist used CBT to positively reinforce her prioritization of needs. Therapist used CBT ask the client to identify her progress with frequency of use with coping skills with continued practice in her daily activity.    Therapist assigned the client homework to practice self care.    Plan: Return again in 4 weeks.  Diagnosis: major depressive disorder, recurrent episode, moderate with anxious distress  Collaboration of Care: Patient refused AEB none requested by the client.  Patient/Guardian was advised Release of Information must be obtained prior to any record release in order to collaborate their care with an outside provider. Patient/Guardian was advised if they have not already done so to contact the registration department to sign all necessary forms in order for [place] to release information regarding their care.   Consent: Patient/Guardian gives verbal consent for treatment and assignment of benefits for services provided during this visit. Patient/Guardian expressed understanding and agreed to  proceed.   Neena Rhymes Makynzi Eastland, LCSW 07/30/2022

## 2022-07-30 NOTE — ED Triage Notes (Signed)
Pt states that she has lupus and its been hot she also works two jobs. She states that her body has been causing issues then she went to work she was lifting water and now she is having right shoulder pain. She has been taking IBU and tylenol without relief. She states she is out of all of her meds and has been for a long while. She does have a PCP but not upcoming appts.

## 2022-07-30 NOTE — ED Provider Notes (Signed)
[facility]    CSN: [id number] Arrival date & time: 07/30/22  1402      History   Chief Complaint Chief Complaint  Patient presents with   Arm Injury    I think I injured my shoulder at work yesterday and am I'm a lot of pain - Entered by patient    HPI Tammy Hale is a 37 y.o. female.   Patient with history of lupus presents to urgent care for evaluation of right arm pain that started a few days ago. She works 2 jobs and states she has been overworking her self lately. Jobs are very physical and she is required to lift very heavy items at work. Works approximately 60 hours per week. Experiencing pain from the right shoulder that radiates to the right elbow. Pain is worsened by movement. No extremity weakness, dizziness, chest pain, shortness of breath, heart palpitations, or recent trauma/injuries to the head/neck.  She has been taking tylenol/ibuprofen and using epsom salt baths in attempt to help with pain without much relief.    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      Home Medications    Prior to Admission medications   Medication Sig Start Date End Date Taking? Authorizing Provider  cyclobenzaprine (FLEXERIL) 10 MG tablet Take 1 tablet (10 mg total) by mouth 2 (two) times daily as needed for muscle spasms. 07/30/22  Yes Carlisle Beers, FNP  predniSONE (DELTASONE) 20 MG tablet Take 2 tablets (40 mg total) by mouth daily  with breakfast for 3 days, THEN 1 tablet (20 mg total) daily with breakfast for 3 days, THEN 0.5 tablets (10 mg total) daily with breakfast for 3 days. 07/30/22 08/08/22 Yes StanhopeDonavan Burnet, FNP  mirtazapine (REMERON) 15 MG tablet Take 1 tablet (15 mg total) by mouth at bedtime. 05/24/22   Melida Quitter, PA  prazosin (MINIPRESS) 1 MG capsule Take 1 capsule (1 mg total) by mouth at bedtime. After 2 to 3 days increase dose to 2 mg at bedtime 05/24/22   Saralyn Pilar A, PA  sertraline (ZOLOFT) 25 MG tablet Take 1 tablet (25 mg total) by mouth daily. 05/24/22   Melida Quitter, PA  valACYclovir (VALTREX) 1000 MG tablet Take 0.5 tablets (500 mg total) by mouth 2 (two) times daily. For 3 days. Repeat with recurrent outbreaks. 04/26/22   Carlean Jews, NP    Family History Family History  Problem Relation Age of Onset   Healthy Mother     Social History Social History   Tobacco Use   Smoking status: Every Day    Packs/day: 1    Types: Cigarettes    Start date: 2014    Passive exposure: Never   Smokeless tobacco: Never  Vaping Use   Vaping Use: Never used  Substance Use Topics   Alcohol use: No   Drug use: Yes    Types: Marijuana  Comment: former user     Allergies   Penicillins   Review of Systems Review of Systems Per HPI  Physical Exam Triage Vital Signs ED Triage Vitals  Enc Vitals Group     BP 07/30/22 1423 105/69     Pulse Rate 07/30/22 1423 91     Resp 07/30/22 1423 18     Temp 07/30/22 1423 98.3 F (36.8 C)     Temp Source 07/30/22 1423 Oral     SpO2 07/30/22 1423 98 %     Weight --      Height --      Head Circumference --      Peak Flow --      Pain Score 07/30/22 1421 8     Pain Loc --      Pain Edu? --      Excl. in GC? --    No data found.  Updated Vital Signs BP 105/69 (BP Location: Left Arm)   Pulse 91   Temp 98.3 F (36.8 C) (Oral)   Resp 18   LMP 07/20/2022 (Approximate)   SpO2 98%   Visual Acuity Right Eye Distance:    Left Eye Distance:   Bilateral Distance:    Right Eye Near:   Left Eye Near:    Bilateral Near:     Physical Exam Vitals and nursing note reviewed.  Constitutional:      Appearance: She is not ill-appearing or toxic-appearing.  HENT:     Head: Normocephalic and atraumatic.     Right Ear: Hearing and external ear normal.     Left Ear: Hearing and external ear normal.     Nose: Nose normal.     Mouth/Throat:     Lips: Pink.  Eyes:     General: Lids are normal. Vision grossly intact. Gaze aligned appropriately.     Extraocular Movements: Extraocular movements intact.     Conjunctiva/sclera: Conjunctivae normal.  Cardiovascular:     Rate and Rhythm: Normal rate and regular rhythm.     Heart sounds: Normal heart sounds, S1 normal and S2 normal.  Pulmonary:     Effort: Pulmonary effort is normal. No respiratory distress.     Breath sounds: Normal breath sounds and air entry.  Musculoskeletal:     Right shoulder: Tenderness (tenderness to palpation of the generalized right shoulder joint) present. No swelling, deformity, effusion, laceration or crepitus. Decreased range of motion (secondary to tenderness). Normal strength. Normal pulse.     Left shoulder: Normal.     Right upper arm: Normal.     Left upper arm: Normal.     Right elbow: Normal.     Left elbow: Normal.     Cervical back: Normal and neck supple.     Thoracic back: Normal.     Lumbar back: Normal.  Skin:    General: Skin is warm and dry.     Capillary Refill: Capillary refill takes less than 2 seconds.     Findings: No rash.  Neurological:     General: No focal deficit present.     Mental Status: She is alert and oriented to person, place, and time. Mental status is at baseline.     Cranial Nerves: No dysarthria or facial asymmetry.  Psychiatric:        Mood and Affect: Mood normal.        Speech: Speech normal.        Behavior: Behavior normal.        Thought  Content: Thought content normal.         Judgment: Judgment normal.      UC Treatments / Results  Labs (all labs ordered are listed, but only abnormal results are displayed) Labs Reviewed - No data to display  EKG   Radiology No results found.  Procedures Procedures (including critical care time)  Medications Ordered in UC Medications - No data to display  Initial Impression / Assessment and Plan / UC Course  I have reviewed the triage vital signs and the nursing notes.  Pertinent labs & imaging results that were available during my care of the patient were reviewed by me and considered in my medical decision making (see chart for details).  Lupus arthritis, strain of right shoulder Presentation consistent with lupus arthritis and right shoulder strain. Will manage this with rest, gentle ROM exercises, heat therapy, tylenol as needed for pain, and as needed use of muscle relaxer. Drowsiness precautions discussed regarding muscle relaxer use. Symptoms have not responded well to as needed use of tylenol/ibuprofen over the counter, therefore will treat with 9 day prednisone taper as prescribed. No NSAIDs with prednisone taper. Imaging: no indication for imaging based on stable musculoskeletal exam findings May follow-up with orthopedics as needed and PCP.  Discussed red flag signs and symptoms of worsening condition,when to call the PCP office, return to urgent care, and when to seek higher level of care in the emergency department. Counseled patient regarding appropriate use of medications and potential side effects for all medications recommended or prescribed today. Patient verbalizes understanding and agreement with plan. Discharged in stable condition.     Final Clinical Impressions(s) / UC Diagnoses   Final diagnoses:  Lupus arthritis (HCC)  Strain of right shoulder, initial encounter     Discharge Instructions      Prednisone 9 day taper (40mg  for 3 days, 20mg  for 3 days, 10mg  for 3 days). Take with  food. No ibuprofen/other NSAIDs when taking prednisone.  Flexeril as needed for muscle spasm. Do not drive or drink alcohol when taking this medicine as it can make you sleepy.  Schedule appointment with your PCP for ongoing evaluation and management of this in the next 1-2 weeks.   If you develop any new or worsening symptoms or do not improve in the next 2 to 3 days, please return.  If your symptoms are severe, please go to the emergency room.  Follow-up with your primary care provider for further evaluation and management of your symptoms as well as ongoing wellness visits.  I hope you feel better!     ED Prescriptions     Medication Sig Dispense Auth. Provider   predniSONE (DELTASONE) 20 MG tablet Take 2 tablets (40 mg total) by mouth daily with breakfast for 3 days, THEN 1 tablet (20 mg total) daily with breakfast for 3 days, THEN 0.5 tablets (10 mg total) daily with breakfast for 3 days. 11 tablet Carlisle Beers, FNP   cyclobenzaprine (FLEXERIL) 10 MG tablet Take 1 tablet (10 mg total) by mouth 2 (two) times daily as needed for muscle spasms. 20 tablet Carlisle Beers, FNP      PDMP not reviewed this encounter.   Carlisle Beers, [place] 07/30/22 1507

## 2022-07-30 NOTE — Discharge Instructions (Signed)
Prednisone 9 day taper (40mg  for 3 days, 20mg  for 3 days, 10mg  for 3 days). Take with food. No ibuprofen/other NSAIDs when taking prednisone.  Flexeril as needed for muscle spasm. Do not drive or drink alcohol when taking this medicine as it can make you sleepy.  Schedule appointment with your PCP for ongoing evaluation and management of this in the next 1-2 weeks.   If you develop any new or worsening symptoms or do not improve in the next 2 to 3 days, please return.  If your symptoms are severe, please go to the emergency room.  Follow-up with your primary care provider for further evaluation and management of your symptoms as well as ongoing wellness visits.  I hope you feel better!

## 2022-08-09 ENCOUNTER — Ambulatory Visit: Payer: Medicaid Other | Admitting: Family Medicine

## 2022-08-09 NOTE — Progress Notes (Deleted)
   Established Patient Office Visit  Subjective   Patient ID: Tammy Hale, female    DOB: 1985-05-10  Age: 37 y.o. MRN: [id number]  No chief complaint on file.   HPI Tammy Hale is a 37 y.o. female presenting today for follow up of mood, sleep.  ROS Negative unless otherwise noted in HPI   Objective:     LMP 07/20/2022 (Approximate)   Physical Exam   No results found for any visits on 08/09/22.   Assessment & Plan:  There are no diagnoses linked to this encounter.  No follow-ups on file.  Patient previously saw [facility] rheumatology Isabelle Course in August 2020.  Self-reported history of lupus, negative ANA.  She did not meet criteria for lupus.  At that time she was referred to neurology for numbness and tingling. ANA - CRP < 5 ESR 4 RAF -  + photosensitivity rashes  No Raynaud's symptoms  No mucosal ulcers + mouth dryness No history of pleurisy or pericarditis  No history of DVT, MI, or strokes.  No history of hematuria or nephritis. No reported seizures.   Melida Quitter, PA

## 2022-08-29 ENCOUNTER — Ambulatory Visit (HOSPITAL_COMMUNITY): Payer: Medicaid Other

## 2022-09-01 ENCOUNTER — Ambulatory Visit (HOSPITAL_COMMUNITY): Payer: Medicaid Other

## 2022-09-05 ENCOUNTER — Other Ambulatory Visit: Payer: Self-pay

## 2022-09-05 ENCOUNTER — Emergency Department (HOSPITAL_COMMUNITY)
Admission: EM | Admit: 2022-09-05 | Discharge: 2022-09-06 | Discharge status: Against Medical Advice | Payer: Medicaid Other | Attending: Emergency Medicine | Admitting: Emergency Medicine

## 2022-09-05 ENCOUNTER — Encounter (HOSPITAL_COMMUNITY): Payer: Self-pay

## 2022-09-05 DIAGNOSIS — R112 Nausea with vomiting, unspecified: Secondary | ICD-10-CM | POA: Insufficient documentation

## 2022-09-05 DIAGNOSIS — R200 Anesthesia of skin: Secondary | ICD-10-CM | POA: Insufficient documentation

## 2022-09-05 DIAGNOSIS — R109 Unspecified abdominal pain: Secondary | ICD-10-CM | POA: Diagnosis not present

## 2022-09-05 DIAGNOSIS — Z5321 Procedure and treatment not carried out due to patient leaving prior to being seen by health care provider: Secondary | ICD-10-CM | POA: Diagnosis not present

## 2022-09-05 DIAGNOSIS — R519 Headache, unspecified: Secondary | ICD-10-CM | POA: Diagnosis present

## 2022-09-05 LAB — URINALYSIS, ROUTINE W REFLEX MICROSCOPIC
Bilirubin Urine: NEGATIVE
Glucose, UA: NEGATIVE mg/dL
Ketones, ur: NEGATIVE mg/dL
Leukocytes,Ua: NEGATIVE
Nitrite: NEGATIVE
Protein, ur: NEGATIVE mg/dL
Specific Gravity, Urine: 1.013 (ref 1.005–1.030)
pH: 5 (ref 5.0–8.0)

## 2022-09-05 LAB — PREGNANCY, URINE: Preg Test, Ur: NEGATIVE

## 2022-09-05 MED ORDER — ACETAMINOPHEN 325 MG PO TABS
650.0000 mg | ORAL_TABLET | Freq: Once | ORAL | Status: AC
  Administered 2022-09-05: 650 mg via ORAL
  Filled 2022-09-05: qty 2

## 2022-09-05 MED ORDER — METOCLOPRAMIDE HCL 10 MG PO TABS
10.0000 mg | ORAL_TABLET | Freq: Once | ORAL | Status: AC
  Administered 2022-09-05: 10 mg via ORAL
  Filled 2022-09-05: qty 1

## 2022-09-05 NOTE — ED Notes (Signed)
Pt called to be roomed, no response 

## 2022-09-05 NOTE — ED Notes (Signed)
Pt called to be roomed repeatedly, no response

## 2022-09-05 NOTE — ED Triage Notes (Signed)
Pt reports headache behind her eyes, light sensitivity; ongoing for 5 days. Hx of migraines but states this is different because the left side of her face feels numb which started at 0900. She is also reporting abd pain and wants to be tested for a UTI.

## 2022-09-05 NOTE — ED Provider Triage Note (Signed)
Emergency Medicine Provider Triage Evaluation Note  Tammy Hale , a 37 y.o. female  was evaluated in triage.  Pt complains of bad headache.  She has a history of migraine headaches.  She does not take any medications for her migraine headaches except over-the-counter Excedrin Migraine.  History of lupus reported in EMR.  She complains of left-sided throbbing headache and facial numbness which she normally gets with her headache.  She has had light sensitivity nausea and vomiting.  She reports this headache is more severe than others..  Review of Systems  Positive: Bad headache Negative: Vomiting  Physical Exam  BP 112/82 (BP Location: Right Arm)   Pulse 69   Temp 98.6 F (37 C) (Oral)   Resp 16   Ht 5\' 6"  (1.676 m)   Wt 64.9 kg   LMP 09/05/2022   SpO2 100%   BMI 23.08 kg/m  Gen:   Awake, no distress   Resp:  Normal effort  MSK:   Moves extremities without difficulty  Other:    Medical Decision Making  Medically screening exam initiated at 4:36 PM.  Appropriate orders placed.  Annie Ellin was informed that the remainder of the evaluation will be completed by another provider, this initial triage assessment does not replace that evaluation, and the importance of remaining in the ED until their evaluation is complete.     Arthor Captain, PA-C 09/05/22 [phone]

## 2022-09-06 ENCOUNTER — Ambulatory Visit (HOSPITAL_COMMUNITY): Payer: Medicaid Other

## 2022-09-09 ENCOUNTER — Ambulatory Visit (HOSPITAL_COMMUNITY): Payer: Medicaid Other

## 2022-09-14 ENCOUNTER — Ambulatory Visit (HOSPITAL_COMMUNITY): Payer: Medicaid Other

## 2022-09-15 ENCOUNTER — Ambulatory Visit (HOSPITAL_COMMUNITY)
Admission: EM | Admit: 2022-09-15 | Discharge: 2022-09-15 | Disposition: A | Discharge status: Home / Self Care | Payer: Medicaid Other | Attending: Emergency Medicine | Admitting: Emergency Medicine

## 2022-09-15 ENCOUNTER — Encounter (HOSPITAL_COMMUNITY): Payer: Self-pay | Admitting: Emergency Medicine

## 2022-09-15 DIAGNOSIS — N760 Acute vaginitis: Secondary | ICD-10-CM | POA: Insufficient documentation

## 2022-09-15 DIAGNOSIS — R519 Headache, unspecified: Secondary | ICD-10-CM | POA: Insufficient documentation

## 2022-09-15 DIAGNOSIS — L309 Dermatitis, unspecified: Secondary | ICD-10-CM | POA: Diagnosis not present

## 2022-09-15 LAB — POCT URINALYSIS DIP (MANUAL ENTRY)
Bilirubin, UA: NEGATIVE
Glucose, UA: NEGATIVE mg/dL
Ketones, POC UA: NEGATIVE mg/dL
Leukocytes, UA: NEGATIVE
Nitrite, UA: NEGATIVE
Protein Ur, POC: NEGATIVE mg/dL
Spec Grav, UA: 1.015 (ref 1.010–1.025)
Urobilinogen, UA: 0.2 E.U./dL
pH, UA: 6 (ref 5.0–8.0)

## 2022-09-15 LAB — POCT URINE PREGNANCY: Preg Test, Ur: NEGATIVE

## 2022-09-15 MED ORDER — TRIAMCINOLONE ACETONIDE 0.1 % EX CREA: 1.0000 | TOPICAL_CREAM | Freq: Two times a day (BID) | CUTANEOUS | 0 refills | Status: AC

## 2022-09-15 NOTE — ED Triage Notes (Signed)
Pt having abd pain for 4-5 days that has been constant. Reports dysuria and vaginal discharge that is white that has an odor.   Pt requesting medications for her eczema flare up.

## 2022-09-15 NOTE — Discharge Instructions (Addendum)
It is important that you follow-up with your primary care provider regarding your recurrent headaches.  I suggest keeping a journal of your headaches to help identify your triggers.  Please ensure you drink at least 64 ounces of water daily, getting enough sleep, and managing her stressors.  Your urine did not show signs of urinary tract infection.  We will contact you if your swab results is positive for yeast or bacterial vaginosis to initiate treatment.  Ensure you are using a unscented soap such as Dove sensitive skin.  This should help your eczema and help prevent against vaginitis.  Return to clinic for new or urgent symptoms.  You can come back here if you develop a bad headache as well.

## 2022-09-15 NOTE — ED Provider Notes (Signed)
[facility]    CSN: [id number] Arrival date & time: 09/15/22  1858      History   Chief Complaint Chief Complaint  Patient presents with   Abdominal Pain   Vaginal Discharge    HPI Tammy Hale is a 37 y.o. female.   Patient presents to clinic for multiple complaints.  She recently used a new soap that caused a flareup of eczema in her axilla.  She has been using her daughters steroid cream, unsure what it is called.  He reports it is not helping.  Would like a prescription for this.  She also has been having intermittent severe headaches that have been ongoing.  When they happen she has photosensitivity and reports seeing dots in her visions.  She has not had 1 for the past 2 days.  When she normally has a bad headache she does take Excedrin, Tylenol or ibuprofen.  She has not yet seen her primary care provider for these issues.  Reports she is having a balanced diet and managing her stressors.  Reports she is getting enough sleep.  When she used a new soap she noticed a few days later that she was having changes to her vaginal discharge.  Reports an odor and thick vaginal discharge with lower abdominal pain.  Denies any nausea, vomiting or fevers.  Thinks she might have urinary tract infection, endorses some dysuria.  She has not been sexually active in the past 2 years, denies concern for sexually transmitted infections.   The history is provided by the patient and medical records.  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      Home Medications    Prior to Admission medications   Medication Sig Start Date End Date Taking? Authorizing Provider  triamcinolone cream (KENALOG) 0.1 % Apply 1 Application topically 2 (two) times daily. 09/15/22  Yes Rinaldo Ratel, [place], FNP    Family History Family History  Problem Relation Age of Onset   Healthy Mother     Social History Social History   Tobacco Use   Smoking status: Every Day    Current packs/day: 1.00    Average packs/day: 1 pack/day for 10.6 years (10.6 ttl pk-yrs)    Types: Cigarettes    Start date: 2014    Passive exposure: Never   Smokeless tobacco: Never  Vaping Use   Vaping status: Never Used  Substance Use Topics   Alcohol use: No   Drug use: Yes    Types: Marijuana    Comment: former user     Allergies   Penicillins   Review of Systems Review of Systems  Constitutional:  Negative for fever.  Gastrointestinal:  Positive for  abdominal pain. Negative for nausea and vomiting.  Genitourinary:  Positive for dysuria and vaginal discharge.     Physical Exam Triage Vital Signs ED Triage Vitals  Encounter Vitals Group     BP 09/15/22 1911 117/73     Systolic BP Percentile --      Diastolic BP Percentile --      Pulse Rate 09/15/22 1911 77     Resp 09/15/22 1911 16     Temp 09/15/22 1911 98.8 F (37.1 C)     Temp Source 09/15/22 1911 Oral     SpO2 09/15/22 1911 97 %     Weight --      Height --      Head Circumference --      Peak Flow --      Pain Score 09/15/22 1910 5     Pain Loc --      Pain Education --      Exclude from Growth Chart --    No data found.  Updated Vital Signs BP  117/73 (BP Location: Left Arm)   Pulse 77   Temp 98.8 F (37.1 C) (Oral)   Resp 16   LMP 09/09/2022   SpO2 97%   Visual Acuity Right Eye Distance:   Left Eye Distance:   Bilateral Distance:    Right Eye Near:   Left Eye Near:    Bilateral Near:     Physical Exam Vitals and nursing note reviewed.  Constitutional:      Appearance: Normal appearance. She is well-developed.  HENT:     Head: Normocephalic and atraumatic.     Right Ear: External ear normal.     Left Ear: External ear normal.     Nose: Nose normal.     Mouth/Throat:     Mouth: Mucous membranes are moist.  Cardiovascular:     Rate and Rhythm: Normal rate and regular rhythm.     Heart sounds: Normal heart sounds. No murmur heard. Pulmonary:     Effort: Pulmonary effort is normal. No respiratory distress.     Breath sounds: Normal breath sounds.  Abdominal:     General: Abdomen is flat. Bowel sounds are normal. There is no distension.     Palpations: Abdomen is soft.     Tenderness: There is abdominal tenderness in the suprapubic area.     Hernia: No hernia is present.  Musculoskeletal:        General: Normal range of motion.     Cervical back: Normal range of motion.  Skin:    General: Skin is warm and dry.     Findings: Rash present. Rash is urticarial.     Comments: Urticarial scaling rash to bilateral axilla.  Neurological:     General: No focal deficit present.     Mental Status: She is alert and oriented to person, place, and time.  Psychiatric:        Mood and Affect: Mood normal.        Behavior: Behavior normal.      UC Treatments / Results  Labs (all labs ordered are listed, but only abnormal results are displayed) Labs Reviewed  POCT URINALYSIS DIP (MANUAL ENTRY) - Abnormal; Notable for the following components:      Result Value   Clarity, UA hazy (*)    Blood, UA trace-lysed (*)    All other components within normal limits  POCT URINE PREGNANCY  CERVICOVAGINAL ANCILLARY ONLY     EKG   Radiology No  results found.  Procedures Procedures (including critical care time)  Medications Ordered in UC Medications - No data to display  Initial Impression / Assessment and Plan / UC Course  I have reviewed the triage vital signs and the nursing notes.  Pertinent labs & imaging results that were available during my care of the patient were reviewed by me and considered in my medical decision making (see chart for details).  Vitals in triage reviewed, patient is hemodynamically stable.  Presents to clinic for recurrent headaches, none currently.  Changes to vaginal discharge and suprapubic pain consistent with vaginitis.  Urine pregnancy negative and no concern for STIs.  Urinalysis with trace red blood cells, low concern for urinary tract infection.  Cytology swab obtained to rule out BV versus yeast.  Staff to contact if treatment is indicated.  Does have urticarial rash to bilateral axilla, will trial triamcinolone ointment.  Encouraged follow-up with primary care provider for further management of recurrent issues.  Plan of care, follow-up care and return precautions given, no questions at this time.    Final Clinical Impressions(s) / UC Diagnoses   Final diagnoses:  Bad headache  Acute vaginitis  Eczema, unspecified type     Discharge Instructions      It is important that you follow-up with your primary care provider regarding your recurrent headaches.  I suggest keeping a journal of your headaches to help identify your triggers.  Please ensure you drink at least 64 ounces of water daily, getting enough sleep, and managing her stressors.  Your urine did not show signs of urinary tract infection.  We will contact you if your swab results is positive for yeast or bacterial vaginosis to initiate treatment.  Ensure you are using a unscented soap such as Dove sensitive skin.  This should help your eczema and help prevent against vaginitis.  Return to clinic for  new or urgent symptoms.  You can come back here if you develop a bad headache as well.       ED Prescriptions     Medication Sig Dispense Auth. Provider   triamcinolone cream (KENALOG) 0.1 % Apply 1 Application topically 2 (two) times daily. 30 g Segundo Makela, [place], [place]      PDMP not reviewed this encounter.   Virdell Hoiland, [place], [place] 09/15/22 2004

## 2022-09-16 ENCOUNTER — Ambulatory Visit (HOSPITAL_COMMUNITY): Payer: Medicaid Other | Admitting: Clinical

## 2022-09-16 ENCOUNTER — Telehealth (HOSPITAL_COMMUNITY): Payer: Self-pay | Admitting: Clinical

## 2022-09-16 ENCOUNTER — Telehealth: Payer: Self-pay

## 2022-09-16 ENCOUNTER — Encounter (HOSPITAL_COMMUNITY): Payer: Self-pay

## 2022-09-16 ENCOUNTER — Ambulatory Visit (HOSPITAL_COMMUNITY): Payer: Medicaid Other

## 2022-09-16 MED ORDER — METRONIDAZOLE 500 MG PO TABS: 500.0000 mg | ORAL_TABLET | Freq: Two times a day (BID) | ORAL | 0 refills | Status: AC

## 2022-09-16 NOTE — Telephone Encounter (Signed)
Therapist sent the client alink for the scheduled virtual appt. Client did not check in using the link. Therapist attempted phone call to the client. Client phone stated "call cannot be completed as dialed". Therapist unable to leave a voicemail.

## 2022-09-16 NOTE — Telephone Encounter (Signed)
Per protocol, pt requires tx with metronidazole. Attempted to reach patient x1. LVM. Rx sent to pharmacy on file.

## 2022-09-30 ENCOUNTER — Ambulatory Visit (HOSPITAL_COMMUNITY): Payer: Medicaid Other | Admitting: Clinical

## 2022-09-30 DIAGNOSIS — F331 Major depressive disorder, recurrent, moderate: Secondary | ICD-10-CM | POA: Diagnosis not present

## 2022-10-01 NOTE — Progress Notes (Signed)
THERAPIST PROGRESS NOTE Virtual Visit via Video Note  I connected with Tammy Hale on 09/30/2022 at 11:00 AM EDT by a video enabled telemedicine application and verified that I am speaking with the correct person using two identifiers.  Location: Patient: home Provider: office   I discussed the limitations of evaluation and management by telemedicine and the availability of in person appointments. The patient expressed understanding and agreed to proceed.   Follow Up Instructions: I discussed the assessment and treatment plan with the patient. The patient was provided an opportunity to ask questions and all were answered. The patient agreed with the plan and demonstrated an understanding of the instructions.   The patient was advised to call back or seek an in-person evaluation if the symptoms worsen or if the condition fails to improve as anticipated.   Session Time: 30 minutes  Participation Level: Active  Behavioral Response: CasualAlertEuthymic  Type of Therapy: Individual Therapy  Treatment Goals addressed: Tammy Hale 3 times per week for the next 12 weeks   ProgressTowards Goals: Progressing  Interventions: CBT and Supportive  Summary:  Tammy Hale is a 37 y.o. female who presents for the scheduled appointment oriented times five, appropriately dressed and friendly. Tammy Hale denied hallucinations and delusions. Tammy Hale reported she has been tired and had some stress. 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 Tammy Hale reported she is tired working them back to back. Tammy Hale reported she is always tired. Tammy Hale reported she is working to pay off debt and keep herself afloat until she can go down to 1 job. Tammy Hale reported she is happy she will be off of probation next month instead of November 2024. Tammy Hale reported she can get her license in November this year. Tammy Hale reported court is still on pause  regarding her other 2 girls. Tammy Hale reported she is back to no communication with her mother. Tammy Hale reported her mother is not a positive support person. Tammy Hale reported she does have a relationship with her sisters which is fairly okay. Tammy Hale reported overall she is grateful for being able to provide for her and her kids. Evidence of progress towards goal:  Tammy Hale reported 1 positive of maintaining employment and resolving legal issue.   Suicidal/Homicidal: Nowithout intent/plan  Therapist Response:  Therapist began the appointment asking the Tammy Hale how she has been doing. Therapist used CBT to engage using active listening and positive emotional support. Therapist used CBT to engage asking her to describe changes to her psychosocial stressors. Therapist used CBT to positively reinforce the Tammy Hale problem solving Hale and discuss boundaries. Therapist used CBT ask the Tammy Hale to identify her progress with frequency of use with coping Hale with continued practice in her daily activity.    Therapist assigned the Tammy Hale homework to practice self care.    Plan: Return again in 4 weeks.  Diagnosis: mdd, recurrent episode, moderate with anxious distress  Collaboration of Care: Patient refused AEB none requested by the Tammy Hale.  Patient/Guardian was advised Release of Information must be obtained prior to any record release in order to collaborate their care with an outside provider. Patient/Guardian was advised if they have not already done so to contact the registration department to sign all necessary forms in order for [place] to release information regarding their care.   Consent: Patient/Guardian gives verbal consent for treatment and assignment of benefits for services provided during this visit. Patient/Guardian expressed understanding and agreed to proceed.   Neena Rhymes  Sherriann Szuch, LCSW 09/30/2022

## 2022-10-13 ENCOUNTER — Ambulatory Visit (HOSPITAL_COMMUNITY): Payer: Medicaid Other

## 2022-10-16 ENCOUNTER — Ambulatory Visit (HOSPITAL_COMMUNITY): Payer: Medicaid Other

## 2022-10-25 ENCOUNTER — Ambulatory Visit: Payer: Medicaid Other | Admitting: Family Medicine

## 2022-10-28 ENCOUNTER — Ambulatory Visit (INDEPENDENT_AMBULATORY_CARE_PROVIDER_SITE_OTHER): Payer: Medicaid Other | Admitting: Clinical

## 2022-10-28 DIAGNOSIS — F331 Major depressive disorder, recurrent, moderate: Secondary | ICD-10-CM
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
## 2022-11-01 ENCOUNTER — Ambulatory Visit (INDEPENDENT_AMBULATORY_CARE_PROVIDER_SITE_OTHER): Payer: Medicaid Other | Admitting: Family Medicine

## 2022-11-01 ENCOUNTER — Encounter: Payer: Self-pay | Admitting: Family Medicine

## 2022-11-01 VITALS — BP 111/73 | HR 72 | Resp 18 | Ht 66.0 in | Wt 147.0 lb

## 2022-11-01 DIAGNOSIS — G43719 Chronic migraine without aura, intractable, without status migrainosus: Secondary | ICD-10-CM

## 2022-11-01 DIAGNOSIS — F331 Major depressive disorder, recurrent, moderate: Secondary | ICD-10-CM

## 2022-11-01 DIAGNOSIS — N760 Acute vaginitis: Secondary | ICD-10-CM

## 2022-11-01 DIAGNOSIS — F5104 Psychophysiologic insomnia: Secondary | ICD-10-CM | POA: Diagnosis not present

## 2022-11-01 DIAGNOSIS — F419 Anxiety disorder, unspecified: Secondary | ICD-10-CM

## 2022-11-01 MED ORDER — SUMATRIPTAN SUCCINATE 50 MG PO TABS: 50.0000 mg | ORAL_TABLET | ORAL | 0 refills | Status: DC | PRN

## 2022-11-01 NOTE — Assessment & Plan Note (Signed)
GAD-7 score 14.  Encourage patient to continue to work with therapist and psychiatry starting with her appointment on Wednesday.

## 2022-11-01 NOTE — Progress Notes (Signed)
Established Patient Office Visit  Subjective   Patient ID: Tammy Hale, female    DOB: 02-18-1985  Age: 37 y.o. MRN: [id number]  Chief Complaint  Patient presents with   Anxiety   Depression    HPI Tammy Hale is a 37 y.o. female presenting today for follow up of mood.  She also endorses daily headaches that she has had for about a month.  She describes the headache as pounding, often on both sides, and endorses photophobia.  Denies nausea or vomiting.  Over-the-counter ibuprofen, Tylenol, Excedrin Migraine have not alleviated headaches.  Additionally, she continues to get vaginitis frequently and would like a referral to OB/GYN.  She is also due for her Pap smear which she can complete there. Mood: Patient is here to follow up for anxiety and depression, not currently taking any of her previous medications.  Prazosin caused worsening nightmares, mirtazapine was ineffective for sleep.  She stated that Zoloft was somewhat helpful but she stopped taking it.  She is seeing a therapist with psychology, and they were able to get her an appointment with psychiatry this upcoming Wednesday.  Recommend follow-up with them to start regimen to improve control of depression and anxiety which I suspect will greatly improve her appetite, sleep, and migraines.      11/01/2022   10:07 AM 05/24/2022   11:13 AM 04/21/2022   10:31 AM  Depression screen PHQ 2/9  Decreased Interest 2 2 3   Down, Depressed, Hopeless 2 2 3   PHQ - 2 Score 4 4 6   Altered sleeping 3 3 3   Tired, decreased energy 2 3 3   Change in appetite 2 3 3   Feeling bad or failure about yourself  2 3 3   Trouble concentrating 2 3 3   Moving slowly or fidgety/restless 2 3 0  Suicidal thoughts 0 0 0  PHQ-9 Score 17 22 21   Difficult doing work/chores Very difficult Somewhat difficult Very difficult       11/01/2022   10:08 AM 05/24/2022   11:13 AM 04/21/2022   10:31 AM 04/01/2022   10:42 AM  GAD 7 : Generalized Anxiety Score  Nervous,  Anxious, on Edge 2 2 3 3   Control/stop worrying 2 3 3 3   Worry too much - different things 2 3 3 3   Trouble relaxing 2 3 3 3   Restless 2 1 1 1   Easily annoyed or irritable 2 2 3 3   Afraid - awful might happen 2 2 3 3   Total GAD 7 Score 14 16 19 19   Anxiety Difficulty Very difficult Somewhat difficult Very difficult Very difficult   Outpatient Medications Prior to Visit  Medication Sig   triamcinolone cream (KENALOG) 0.1 % Apply 1 Application topically 2 (two) times daily.   No facility-administered medications prior to visit.    ROS Negative unless otherwise noted in HPI   Objective:     BP 111/73 (BP Location: Left Arm, Patient Position: Sitting, Cuff Size: Normal)   Pulse 72   Resp 18   Ht 5\' 6"  (1.676 m)   Wt 147 lb (66.7 kg)   SpO2 98%   BMI 23.73 kg/m   Physical Exam Constitutional:      General: She is not in acute distress.    Appearance: Normal appearance.  HENT:     Head: Normocephalic and atraumatic.  Pulmonary:     Effort: Pulmonary effort is normal. No respiratory distress.  Musculoskeletal:     Cervical back: Normal range of motion.  Neurological:  General: No focal deficit present.     Mental Status: She is alert and oriented to person, place, and time. Mental status is at baseline.  Psychiatric:        Mood and Affect: Mood normal.        Thought Content: Thought content normal.        Judgment: Judgment normal.     Assessment & Plan:  Moderate episode of recurrent major depressive disorder (HCC) Assessment & Plan: PHQ-9 score 17.  Continue therapy and establish care with psychiatry.   Anxiety Assessment & Plan: GAD-7 score 14.  Encourage patient to continue to work with therapist and psychiatry starting with her appointment on Wednesday.   Psychophysiological insomnia Assessment & Plan: Mirtazapine ineffective, prazosin caused worsening nightmares.  Recommend management with psychiatry.   Intractable chronic migraine without aura  and without status migrainosus Assessment & Plan: Unresponsive to over-the-counter options.  Trial of sumatriptan 50 mg as needed for acute migraine.  If ineffective, will try another triptan medication.  We discussed that if she continues to have daily headaches we may also need to consider prophylactic medication.  Patient verbalized understanding and is agreeable to this plan.  Orders: -     SUMAtriptan Succinate; Take 1 tablet (50 mg total) by mouth every 2 (two) hours as needed for migraine. May repeat in 2 hours if headache persists or recurs.  Dispense: 30 tablet; Refill: 0  Recurrent vaginitis -     Ambulatory referral to Obstetrics / Gynecology    Return in about 2 months (around 01/01/2023) for follow-up for migraine, mood.    Melida Quitter, PA

## 2022-11-01 NOTE — Assessment & Plan Note (Signed)
PHQ-9 score 17.  Continue therapy and establish care with psychiatry.

## 2022-11-01 NOTE — Assessment & Plan Note (Signed)
Mirtazapine ineffective, prazosin caused worsening nightmares.  Recommend management with psychiatry.

## 2022-11-01 NOTE — Assessment & Plan Note (Signed)
Unresponsive to over-the-counter options.  Trial of sumatriptan 50 mg as needed for acute migraine.  If ineffective, will try another triptan medication.  We discussed that if she continues to have daily headaches we may also need to consider prophylactic medication.  Patient verbalized understanding and is agreeable to this plan.

## 2022-11-03 ENCOUNTER — Telehealth (HOSPITAL_COMMUNITY): Payer: Medicaid Other | Admitting: Psychiatry

## 2022-11-03 ENCOUNTER — Encounter (HOSPITAL_COMMUNITY): Payer: Self-pay | Admitting: Psychiatry

## 2022-11-03 DIAGNOSIS — F17210 Nicotine dependence, cigarettes, uncomplicated: Secondary | ICD-10-CM | POA: Diagnosis not present

## 2022-11-03 DIAGNOSIS — F333 Major depressive disorder, recurrent, severe with psychotic symptoms: Secondary | ICD-10-CM | POA: Diagnosis not present

## 2022-11-03 DIAGNOSIS — F4310 Post-traumatic stress disorder, unspecified: Secondary | ICD-10-CM | POA: Diagnosis not present

## 2022-11-03 DIAGNOSIS — F17200 Nicotine dependence, unspecified, uncomplicated: Secondary | ICD-10-CM

## 2022-11-03 DIAGNOSIS — F411 Generalized anxiety disorder: Secondary | ICD-10-CM | POA: Diagnosis not present

## 2022-11-03 MED ORDER — PRAZOSIN HCL 1 MG PO CAPS
1.0000 mg | ORAL_CAPSULE | Freq: Every day | ORAL | 3 refills | Status: DC
Start: 2022-11-03 — End: 2023-01-25

## 2022-11-03 MED ORDER — HYDROXYZINE HCL 10 MG PO TABS: 10.0000 mg | ORAL_TABLET | Freq: Three times a day (TID) | ORAL | 3 refills | Status: DC | PRN

## 2022-11-03 MED ORDER — QUETIAPINE FUMARATE 25 MG PO TABS
25.0000 mg | ORAL_TABLET | Freq: Every day | ORAL | 3 refills | Status: DC
Start: 2022-11-03 — End: 2023-01-25

## 2022-11-03 MED ORDER — NICOTINE 21 MG/24HR TD PT24: 21.0000 mg | MEDICATED_PATCH | Freq: Every day | TRANSDERMAL | 3 refills | Status: DC
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
## 2022-11-18 ENCOUNTER — Ambulatory Visit (INDEPENDENT_AMBULATORY_CARE_PROVIDER_SITE_OTHER): Payer: Medicaid Other | Admitting: Clinical

## 2022-11-18 DIAGNOSIS — F411 Generalized anxiety disorder: Secondary | ICD-10-CM | POA: Diagnosis not present

## 2022-11-21 IMAGING — CT CT HEAD W/O CM
3 series · 15 of 47 positions shown, 18 images · non-contrast
Comparison: None Available.

CLINICAL DATA: Migraine headache for 2 hours, nausea and vomiting,
photophobia



[Series 2: head wo · axial · 0.44mm/px · z∈[-142,-2]mm · 9 of 34 slices shown, 12 images]
[im 3/34  brain]
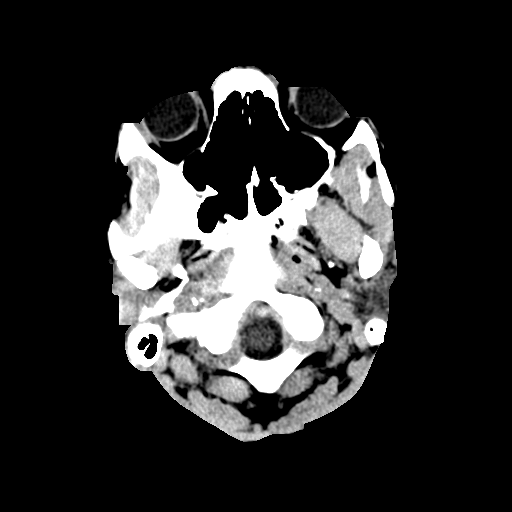
[im 3/34  bone]
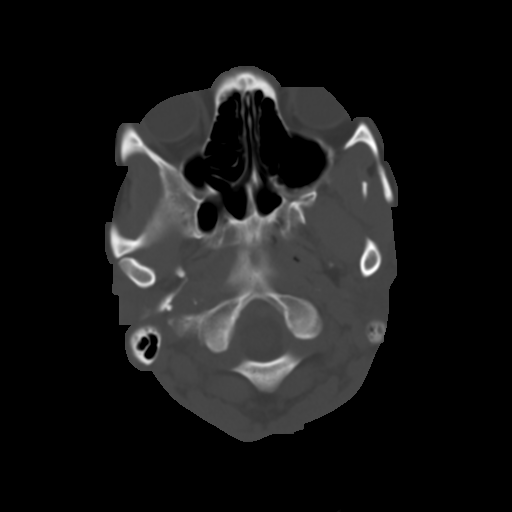
[im 6/34  brain]
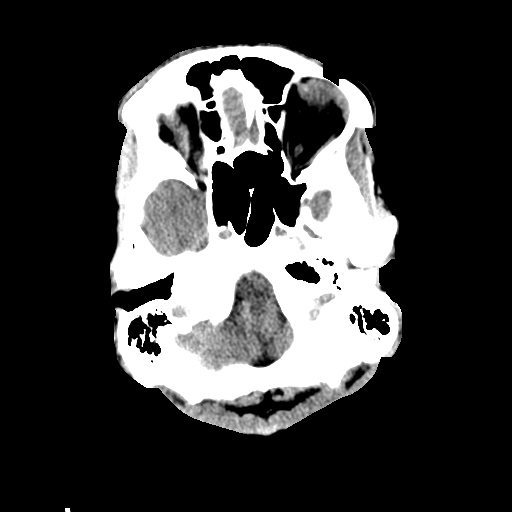
[im 10/34  brain]
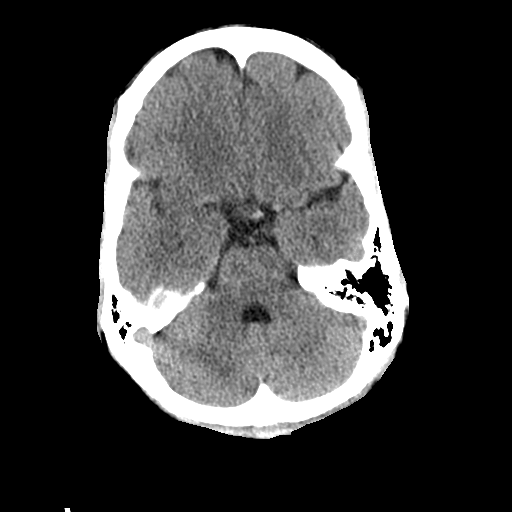
[im 13/34  brain]
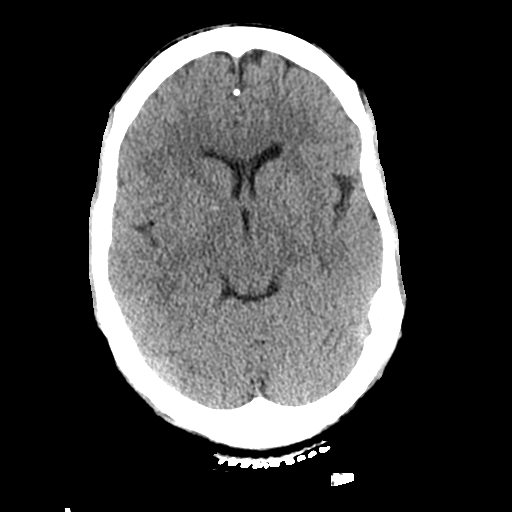
[im 18/34  brain]
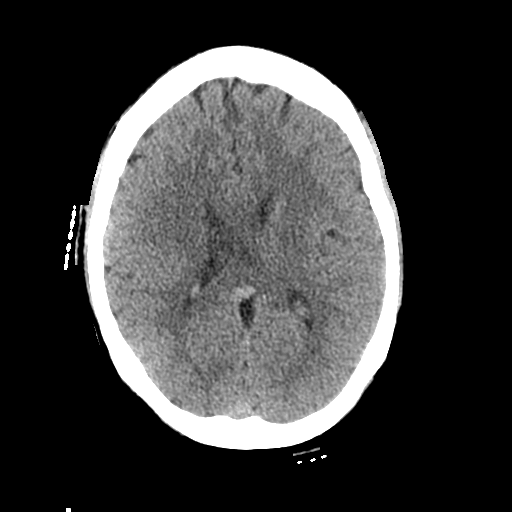
[im 18/34  bone]
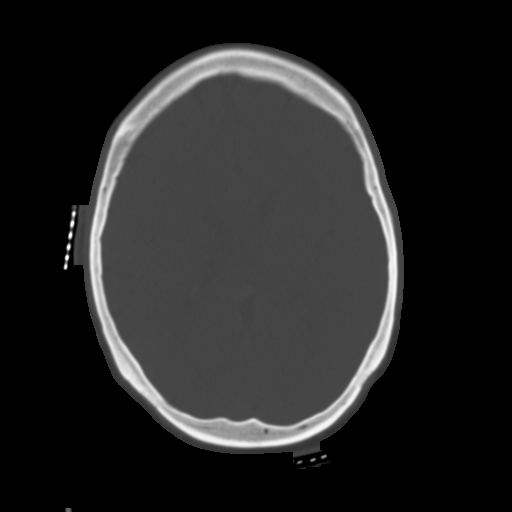
[im 21/34  brain]
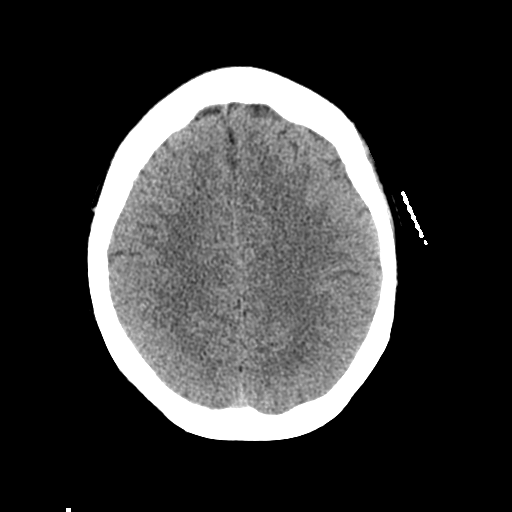
[im 24/34  brain]
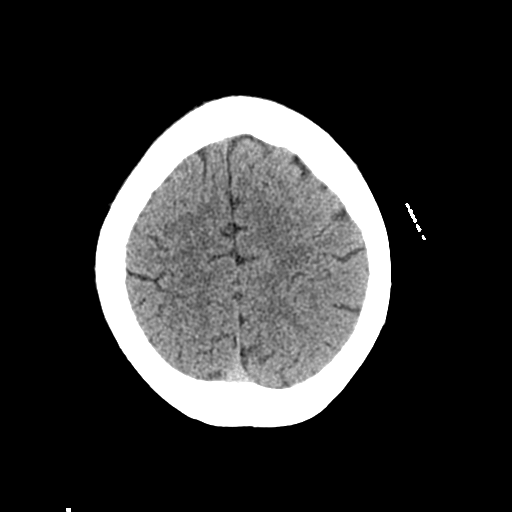
[im 28/34  brain]
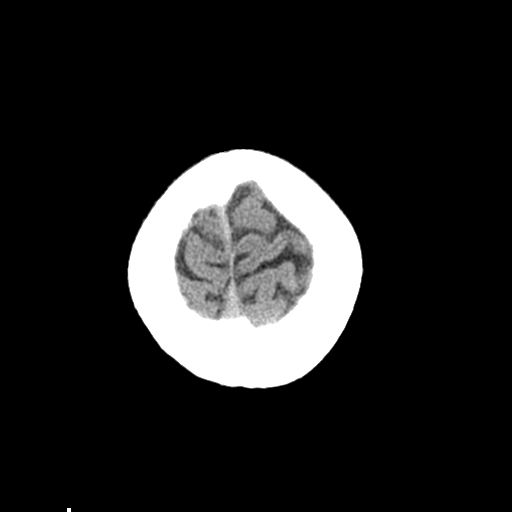
[im 31/34  brain]
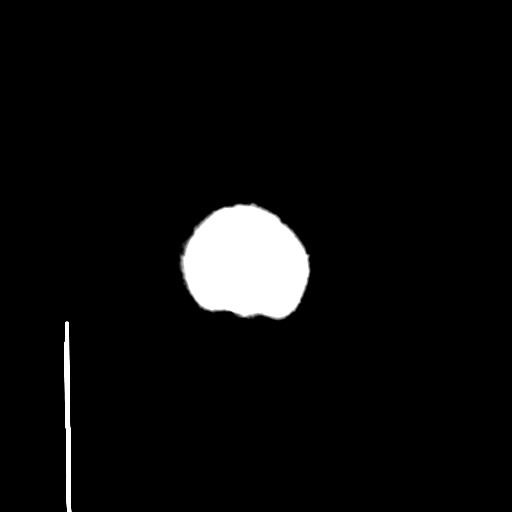
[im 31/34  bone]
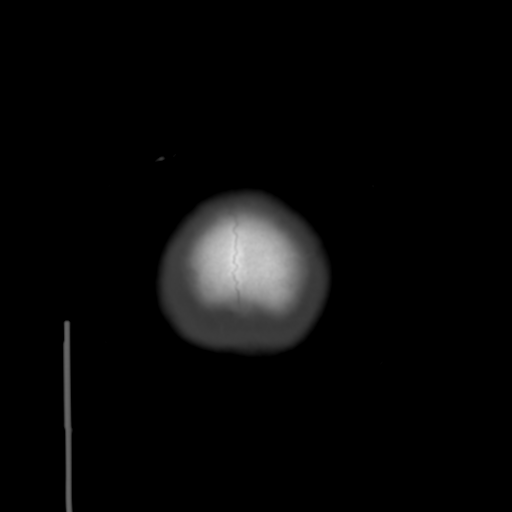

[Series 5: coronal soft tissue · coronal · 0.33mm/px · 3 of 73 slices shown]
[im 25/73  brain]
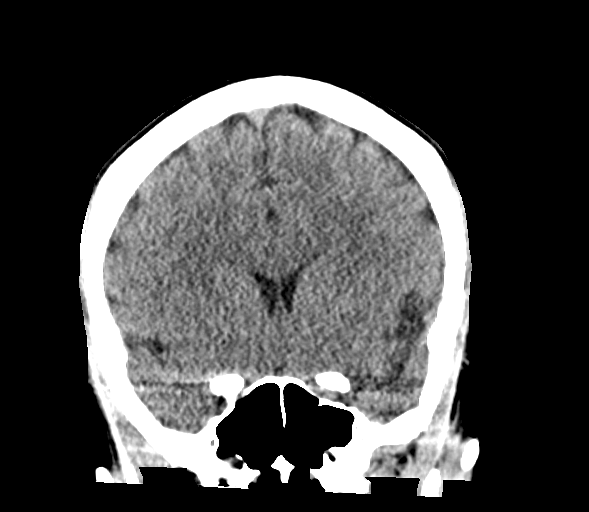
[im 33/73  brain]
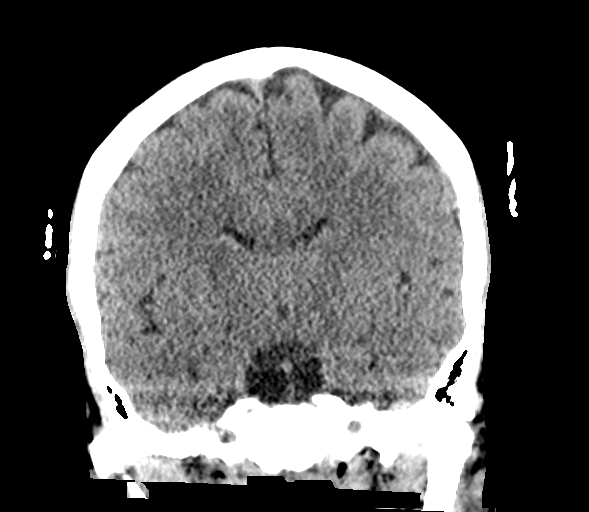
[im 41/73  brain]
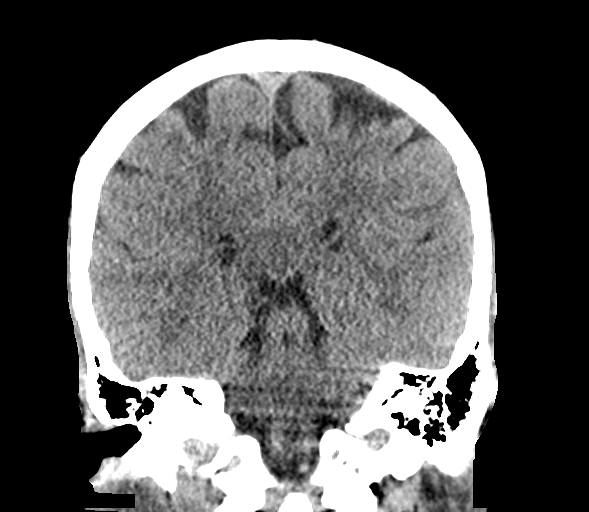

[Series 6: sagittal soft tissue · sagittal · 0.33mm/px · 3 of 65 slices shown]
[im 22/65  brain]
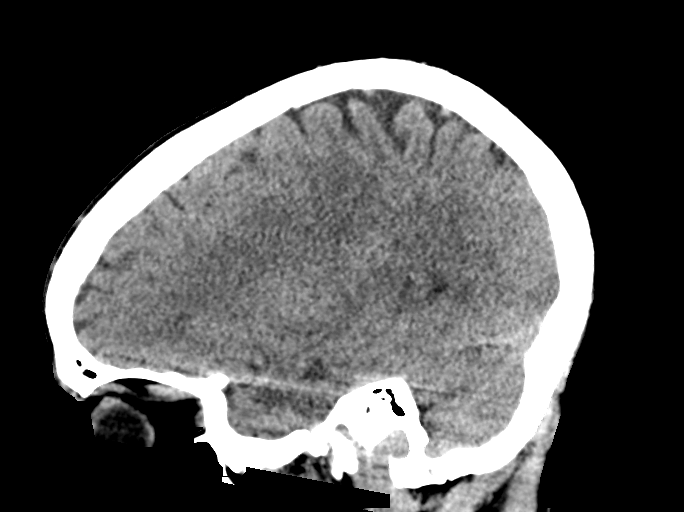
[im 33/65  brain]
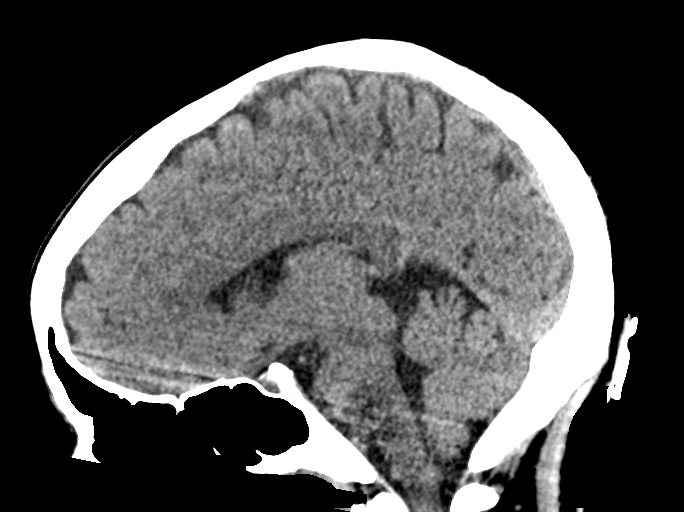
[im 43/65  brain]
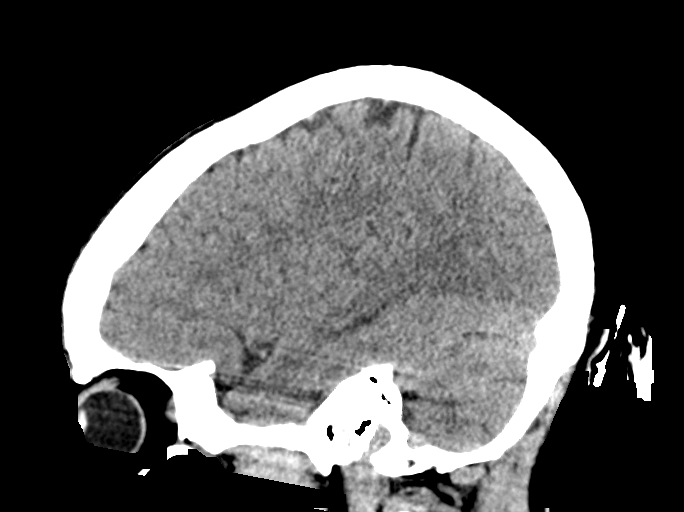

[15 of 47 positions shown; findings below may reference images not displayed]

FINDINGS: Brain: No acute infarct or hemorrhage. Lateral ventricles and
midline structures are grossly unremarkable. No acute extra-axial
fluid collections. No mass effect.

Vascular: No hyperdense vessel or unexpected calcification.

Skull: Normal. Negative for fracture or focal lesion.

Sinuses/Orbits: No acute finding.

Other: None.
IMPRESSION: 1. No acute intracranial process.
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
## 2023-01-03 ENCOUNTER — Ambulatory Visit: Payer: Medicaid Other | Admitting: Family Medicine

## 2023-01-25 ENCOUNTER — Encounter (HOSPITAL_COMMUNITY): Payer: Self-pay | Admitting: Psychiatry

## 2023-01-25 ENCOUNTER — Telehealth (INDEPENDENT_AMBULATORY_CARE_PROVIDER_SITE_OTHER): Payer: MEDICAID | Admitting: Psychiatry

## 2023-01-25 DIAGNOSIS — F4310 Post-traumatic stress disorder, unspecified: Secondary | ICD-10-CM | POA: Diagnosis not present

## 2023-01-25 DIAGNOSIS — F17200 Nicotine dependence, unspecified, uncomplicated: Secondary | ICD-10-CM

## 2023-01-25 DIAGNOSIS — F411 Generalized anxiety disorder: Secondary | ICD-10-CM

## 2023-01-25 DIAGNOSIS — F17210 Nicotine dependence, cigarettes, uncomplicated: Secondary | ICD-10-CM | POA: Diagnosis not present

## 2023-01-25 DIAGNOSIS — F331 Major depressive disorder, recurrent, moderate: Secondary | ICD-10-CM

## 2023-01-25 MED ORDER — RISPERIDONE 1 MG PO TABS
1.0000 mg | ORAL_TABLET | Freq: Every day | ORAL | 3 refills | Status: DC
Start: 2023-01-25 — End: 2023-03-30

## 2023-01-25 MED ORDER — NICOTINE 21 MG/24HR TD PT24
21.0000 mg | MEDICATED_PATCH | Freq: Every day | TRANSDERMAL | 3 refills | Status: DC
Start: 2023-01-25 — End: 2023-03-30

## 2023-01-25 MED ORDER — PRAZOSIN HCL 2 MG PO CAPS
2.0000 mg | ORAL_CAPSULE | Freq: Every day | ORAL | 3 refills | Status: DC
Start: 2023-01-25 — End: 2023-03-30

## 2023-01-25 MED ORDER — HYDROXYZINE HCL 25 MG PO TABS
25.0000 mg | ORAL_TABLET | Freq: Three times a day (TID) | ORAL | 3 refills | Status: DC | PRN
Start: 2023-01-25 — End: 2023-03-30
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
## 2023-02-11 ENCOUNTER — Telehealth (HOSPITAL_COMMUNITY): Payer: Self-pay

## 2023-03-11 ENCOUNTER — Ambulatory Visit (HOSPITAL_COMMUNITY): Payer: Self-pay

## 2023-03-16 ENCOUNTER — Encounter (HOSPITAL_COMMUNITY): Payer: Self-pay

## 2023-03-16 ENCOUNTER — Other Ambulatory Visit: Payer: Self-pay

## 2023-03-16 ENCOUNTER — Emergency Department (HOSPITAL_COMMUNITY)
Admission: EM | Admit: 2023-03-16 | Discharge: 2023-03-16 | Discharge status: Against Medical Advice | Payer: MEDICAID | Attending: Emergency Medicine | Admitting: Emergency Medicine

## 2023-03-16 DIAGNOSIS — Z5321 Procedure and treatment not carried out due to patient leaving prior to being seen by health care provider: Secondary | ICD-10-CM | POA: Diagnosis not present

## 2023-03-16 DIAGNOSIS — Y92410 Unspecified street and highway as the place of occurrence of the external cause: Secondary | ICD-10-CM | POA: Insufficient documentation

## 2023-03-16 DIAGNOSIS — R519 Headache, unspecified: Secondary | ICD-10-CM | POA: Insufficient documentation

## 2023-03-16 DIAGNOSIS — M25561 Pain in right knee: Secondary | ICD-10-CM | POA: Diagnosis present

## 2023-03-16 DIAGNOSIS — M79651 Pain in right thigh: Secondary | ICD-10-CM | POA: Diagnosis not present

## 2023-03-16 MED ORDER — IBUPROFEN 400 MG PO TABS
600.0000 mg | ORAL_TABLET | Freq: Once | ORAL | Status: AC
  Administered 2023-03-16: 600 mg via ORAL
  Filled 2023-03-16: qty 1

## 2023-03-16 MED ORDER — ACETAMINOPHEN 325 MG PO TABS
975.0000 mg | ORAL_TABLET | Freq: Once | ORAL | Status: AC
  Administered 2023-03-16: 975 mg via ORAL
  Filled 2023-03-16: qty 3

## 2023-03-16 NOTE — ED Notes (Signed)
Attempted to call pt for vitals x 3. Unable to get vitals
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
## 2023-03-16 NOTE — ED Notes (Signed)
 No answer to multiple attempts to obtain vitals and imaging

## 2023-03-17 ENCOUNTER — Emergency Department (HOSPITAL_COMMUNITY): Payer: MEDICAID

## 2023-03-17 ENCOUNTER — Encounter: Payer: Self-pay | Admitting: Family Medicine

## 2023-03-17 ENCOUNTER — Ambulatory Visit (HOSPITAL_COMMUNITY): Payer: Medicaid Other

## 2023-03-17 ENCOUNTER — Telehealth (HOSPITAL_COMMUNITY): Payer: Self-pay | Admitting: Psychiatry

## 2023-03-17 ENCOUNTER — Other Ambulatory Visit: Payer: Self-pay

## 2023-03-17 ENCOUNTER — Emergency Department (HOSPITAL_COMMUNITY)
Admission: EM | Admit: 2023-03-17 | Discharge: 2023-03-17 | Disposition: A | Discharge status: Home / Self Care | Payer: MEDICAID | Attending: Emergency Medicine | Admitting: Emergency Medicine

## 2023-03-17 DIAGNOSIS — Y92410 Unspecified street and highway as the place of occurrence of the external cause: Secondary | ICD-10-CM | POA: Diagnosis not present

## 2023-03-17 DIAGNOSIS — M5417 Radiculopathy, lumbosacral region: Secondary | ICD-10-CM | POA: Insufficient documentation

## 2023-03-17 DIAGNOSIS — M5431 Sciatica, right side: Secondary | ICD-10-CM | POA: Insufficient documentation

## 2023-03-17 DIAGNOSIS — M549 Dorsalgia, unspecified: Secondary | ICD-10-CM | POA: Diagnosis present

## 2023-03-17 LAB — POC URINE PREG, ED: Preg Test, Ur: NEGATIVE

## 2023-03-17 MED ORDER — METHOCARBAMOL 500 MG PO TABS: 500.0000 mg | ORAL_TABLET | Freq: Two times a day (BID) | ORAL | 0 refills | Status: DC

## 2023-03-17 MED ORDER — METHOCARBAMOL 500 MG PO TABS
1000.0000 mg | ORAL_TABLET | Freq: Once | ORAL | Status: AC
  Administered 2023-03-17: 1000 mg via ORAL
  Filled 2023-03-17: qty 2

## 2023-03-17 MED ORDER — LIDOCAINE 4 % EX PTCH: 1.0000 | MEDICATED_PATCH | Freq: Two times a day (BID) | CUTANEOUS | 0 refills | Status: AC

## 2023-03-17 MED ORDER — ACETAMINOPHEN 500 MG PO TABS: 500.0000 mg | ORAL_TABLET | Freq: Four times a day (QID) | ORAL | 0 refills | Status: DC | PRN

## 2023-03-17 MED ORDER — NAPROXEN 500 MG PO TABS: 500.0000 mg | ORAL_TABLET | Freq: Two times a day (BID) | ORAL | 0 refills | Status: DC

## 2023-03-17 MED ORDER — HYDROCODONE-ACETAMINOPHEN 5-325 MG PO TABS
1.0000 | ORAL_TABLET | Freq: Once | ORAL | Status: AC
  Administered 2023-03-17: 1 via ORAL
  Filled 2023-03-17: qty 1

## 2023-03-17 MED ORDER — NAPROXEN 250 MG PO TABS
500.0000 mg | ORAL_TABLET | Freq: Once | ORAL | Status: AC
  Administered 2023-03-17: 500 mg via ORAL
  Filled 2023-03-17: qty 2
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
## 2023-03-17 NOTE — Telephone Encounter (Signed)
 Letter received from lawyer Susa Engman legal assistant requesting medical records. Patient has been notified & advised that a ROI has to be signed. Thanks
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
## 2023-03-18 ENCOUNTER — Telehealth (HOSPITAL_COMMUNITY): Payer: Self-pay | Admitting: *Deleted

## 2023-03-18 NOTE — Telephone Encounter (Signed)
 Prior authorization done and approved for Risperidone  1mg . Fax received from [employer] for approval until 03/16/24. Called to notify pharmacy.

## 2023-03-23 ENCOUNTER — Ambulatory Visit: Payer: Medicaid Other | Admitting: Family Medicine

## 2023-03-28 ENCOUNTER — Encounter (HOSPITAL_COMMUNITY): Payer: Self-pay

## 2023-03-28 ENCOUNTER — Ambulatory Visit (HOSPITAL_COMMUNITY)
Admission: RE | Admit: 2023-03-28 | Discharge: 2023-03-28 | Disposition: A | Discharge status: Home / Self Care | Payer: MEDICAID | Source: Ambulatory Visit | Attending: Family Medicine | Admitting: Family Medicine

## 2023-03-28 VITALS — BP 96/60 | HR 82 | Temp 98.2°F | Resp 14

## 2023-03-28 DIAGNOSIS — M545 Low back pain, unspecified: Secondary | ICD-10-CM | POA: Diagnosis not present

## 2023-03-28 DIAGNOSIS — S8991XD Unspecified injury of right lower leg, subsequent encounter: Secondary | ICD-10-CM

## 2023-03-28 HISTORY — DX: Dermatitis, unspecified: L30.9

## 2023-03-28 MED ORDER — MELOXICAM 15 MG PO TABS: 15.0000 mg | ORAL_TABLET | Freq: Every day | ORAL | 0 refills | Status: DC

## 2023-03-28 MED ORDER — KETOROLAC TROMETHAMINE 30 MG/ML IJ SOLN
INTRAMUSCULAR | Status: AC
  Filled 2023-03-28: qty 1

## 2023-03-28 MED ORDER — CYCLOBENZAPRINE HCL 10 MG PO TABS: 10.0000 mg | ORAL_TABLET | Freq: Three times a day (TID) | ORAL | 0 refills | Status: DC | PRN

## 2023-03-28 MED ORDER — KETOROLAC TROMETHAMINE 30 MG/ML IJ SOLN
30.0000 mg | Freq: Once | INTRAMUSCULAR | Status: AC
  Administered 2023-03-28: 30 mg via INTRAMUSCULAR
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
## 2023-03-28 NOTE — Discharge Instructions (Addendum)
Please start taking your meloxicam starting tomorrow.  You can start taking your Flexeril as needed, please note it may cause some drowsiness so I would take it at night first to evaluate.

## 2023-03-30 ENCOUNTER — Telehealth (HOSPITAL_COMMUNITY): Payer: MEDICAID | Admitting: Psychiatry

## 2023-03-30 ENCOUNTER — Encounter (HOSPITAL_COMMUNITY): Payer: Self-pay | Admitting: Psychiatry

## 2023-03-30 DIAGNOSIS — F331 Major depressive disorder, recurrent, moderate: Secondary | ICD-10-CM | POA: Diagnosis not present

## 2023-03-30 DIAGNOSIS — F411 Generalized anxiety disorder: Secondary | ICD-10-CM | POA: Diagnosis not present

## 2023-03-30 DIAGNOSIS — F1290 Cannabis use, unspecified, uncomplicated: Secondary | ICD-10-CM

## 2023-03-30 DIAGNOSIS — F17210 Nicotine dependence, cigarettes, uncomplicated: Secondary | ICD-10-CM

## 2023-03-30 DIAGNOSIS — F4310 Post-traumatic stress disorder, unspecified: Secondary | ICD-10-CM | POA: Diagnosis not present

## 2023-03-30 DIAGNOSIS — F17200 Nicotine dependence, unspecified, uncomplicated: Secondary | ICD-10-CM

## 2023-03-30 MED ORDER — PRAZOSIN HCL 1 MG PO CAPS
3.0000 mg | ORAL_CAPSULE | Freq: Every day | ORAL | 3 refills | Status: DC
Start: 2023-03-30 — End: 2023-04-08

## 2023-03-30 MED ORDER — NICOTINE POLACRILEX 4 MG MT GUM: 4.0000 mg | CHEWING_GUM | OROMUCOSAL | 3 refills | Status: DC | PRN

## 2023-03-30 MED ORDER — RISPERIDONE 3 MG PO TABS
3.0000 mg | ORAL_TABLET | Freq: Every day | ORAL | 3 refills | Status: AC
Start: 2023-03-30 — End: ?

## 2023-03-30 MED ORDER — HYDROXYZINE HCL 25 MG PO TABS
25.0000 mg | ORAL_TABLET | Freq: Three times a day (TID) | ORAL | 3 refills | Status: AC | PRN
Start: 2023-03-30 — End: ?
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
## 2023-04-08 ENCOUNTER — Encounter (HOSPITAL_COMMUNITY): Payer: Self-pay

## 2023-04-08 ENCOUNTER — Ambulatory Visit (HOSPITAL_COMMUNITY)
Admission: RE | Admit: 2023-04-08 | Discharge: 2023-04-08 | Disposition: A | Discharge status: Home / Self Care | Payer: MEDICAID | Source: Ambulatory Visit | Attending: Family Medicine | Admitting: Family Medicine

## 2023-04-08 VITALS — BP 103/66 | HR 72 | Temp 98.4°F | Resp 16 | Ht 66.0 in | Wt 151.0 lb

## 2023-04-08 DIAGNOSIS — J069 Acute upper respiratory infection, unspecified: Secondary | ICD-10-CM | POA: Diagnosis not present

## 2023-04-08 DIAGNOSIS — L03211 Cellulitis of face: Secondary | ICD-10-CM

## 2023-04-08 MED ORDER — CLINDAMYCIN HCL 300 MG PO CAPS: 300.0000 mg | ORAL_CAPSULE | Freq: Three times a day (TID) | ORAL | 0 refills | Status: AC

## 2023-04-08 MED ORDER — TRAMADOL HCL 50 MG PO TABS: 50.0000 mg | ORAL_TABLET | Freq: Four times a day (QID) | ORAL | 0 refills | Status: DC | PRN

## 2023-04-08 NOTE — Discharge Instructions (Signed)
--  Take clindamycin 300 mg-- 1 capsule 3 times daily for 7 days   You have been swabbed for COVID, and the test will result in the next 24 hours. Our staff will call you if positive. If the COVID test is positive, you should quarantine until you are fever free for 24 hours and you are starting to feel better, and then take added precautions for the next 5 days, such as physical distancing/wearing a mask and good hand hygiene/washing.

## 2023-04-08 NOTE — ED Provider Notes (Signed)
 [facility]    CSN: [id number] Arrival date & time: 04/08/23  1241      History   Chief Complaint Chief Complaint  Patient presents with   Facial Pain   Appointment    HPI Tammy Hale is a 38 y.o. female.   HPI Here for swelling painful spot on her right posterior cheek near her ear.  About 2 weeks ago she tried to pop an area and then it got more irritated and started swelling.  No fever noted.  She also for about 3 days has had cough and congestion, but no fever with that either.  No sore throat.  Past medical history significant for lupus and she is on steroids. 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      Home Medications    Prior to Admission medications   Medication Sig Start Date End Date Taking? Authorizing Provider  acetaminophen (TYLENOL) 500 MG tablet Take 1 tablet (500 mg total) by mouth every 6 (six) hours as needed. 03/17/23  Yes Derwood Kaplan, MD  clindamycin (CLEOCIN) 300 MG capsule Take 1 capsule (300 mg total) by mouth 3 (three) times daily for 7 days. 04/08/23 04/15/23 Yes Zenia Resides, MD  hydrOXYzine (ATARAX) 25 MG tablet Take 1 tablet (25 mg total) by mouth 3 (three) times daily as needed. 03/30/23  Yes Toy Cookey E, NP  lidocaine 4 % Place 1 patch onto the  skin 2 (two) times daily. 03/17/23  Yes Derwood Kaplan, MD  methocarbamol (ROBAXIN) 500 MG tablet Take 1 tablet (500 mg total) by mouth 2 (two) times daily. 03/17/23  Yes Derwood Kaplan, MD  nicotine polacrilex (NICORETTE) 4 MG gum Take 1 each (4 mg total) by mouth as needed for smoking cessation. 03/30/23  Yes Toy Cookey E, NP  risperiDONE (RISPERDAL) 3 MG tablet Take 1 tablet (3 mg total) by mouth at bedtime. 03/30/23  Yes Toy Cookey E, NP  SUMAtriptan (IMITREX) 50 MG tablet Take 1 tablet (50 mg total) by mouth every 2 (two) hours as needed for migraine. May repeat in 2 hours if headache persists or recurs. 11/01/22  Yes Saralyn Pilar A, PA  traMADol (ULTRAM) 50 MG tablet Take 1 tablet (50 mg total) by mouth every 6 (six) hours as needed (pain). 04/08/23  Yes Zenia Resides, MD  triamcinolone cream (KENALOG) 0.1 % Apply 1 Application topically 2 (two) times daily. 09/15/22  Yes Garrison, [place] N, FNP    Family History Family History  Problem Relation Age of Onset   Healthy Mother     Social History Social History   Tobacco Use   Smoking status: Every Day    Types: Cigarettes    Start date: 2014    Passive exposure: Never   Smokeless tobacco: Never  Vaping Use   Vaping status: Never Used  Substance Use Topics   Alcohol use: No   Drug use: Not Currently    Types: Marijuana    Comment: former user     Allergies   Penicillins   Review of Systems Review of Systems   Physical Exam Triage Vital Signs ED Triage Vitals  Encounter Vitals Group     BP 04/08/23 1335 103/66     Systolic BP Percentile --      Diastolic BP Percentile --      Pulse Rate 04/08/23 1335 72     Resp 04/08/23 1335 16     Temp 04/08/23 1335 98.4 F (36.9 C)     Temp Source 04/08/23 1335 Oral     SpO2 04/08/23 1335 98 %     Weight 04/08/23 1334 151 lb (68.5 kg)     Height 04/08/23 1334 5\' 6"  (1.676 m)     Head Circumference --      Peak Flow --      Pain Score 04/08/23 1332 7      Pain Loc --      Pain Education --      Exclude from Growth Chart --    No data found.  Updated Vital Signs BP 103/66 (BP Location: Left Arm)   Pulse 72   Temp 98.4 F (36.9 C) (Oral)   Resp 16   Ht 5\' 6"  (1.676 m)   Wt 68.5 kg   LMP 03/13/2023 (Approximate)   SpO2 98%   BMI 24.37 kg/m   Visual Acuity Right Eye Distance:   Left Eye Distance:   Bilateral Distance:    Right Eye Near:   Left Eye Near:    Bilateral Near:     Physical Exam Vitals reviewed.  Constitutional:      General: She is not in acute distress.    Appearance: She is not ill-appearing, toxic-appearing or diaphoretic.  HENT:     Head:     Comments: There is an area of induration and tenderness about 2 cm in diameter on her right lateral cheek near her tragus.  There is no fluctuance.    Right Ear: Tympanic membrane and ear canal normal.     Left Ear: Tympanic membrane and ear canal normal.     Nose: Nose normal.     Mouth/Throat:     Mouth: Mucous membranes are moist.     Pharynx: No oropharyngeal exudate or posterior oropharyngeal erythema.  Eyes:     Extraocular Movements: Extraocular movements intact.     Conjunctiva/sclera: Conjunctivae normal.     Pupils: Pupils are equal, round, and reactive to light.  Cardiovascular:     Rate and Rhythm: Normal rate and regular rhythm.     Heart sounds: No murmur heard. Pulmonary:     Effort: Pulmonary effort is normal. No respiratory distress.     Breath sounds: No stridor. No wheezing, rhonchi or rales.  Musculoskeletal:     Cervical back: Neck supple.  Lymphadenopathy:     Cervical: No cervical adenopathy.  Skin:    Capillary Refill: Capillary refill takes less than 2 seconds.     Coloration: Skin is not jaundiced or pale.  Neurological:     General: No focal deficit present.     Mental Status: She is alert and oriented to person, place, and time.  Psychiatric:        Behavior: Behavior normal.      UC Treatments / Results  Labs (all labs  ordered are listed, but only abnormal results are displayed) Labs Reviewed  SARS CORONAVIRUS 2 (TAT 6-24 HRS)    EKG   Radiology No results found.  Procedures Procedures (including critical care time)  Medications Ordered in UC Medications - No data to display  Initial Impression / Assessment and Plan / UC Course  I have reviewed the triage vital signs and the nursing notes.  Pertinent labs & imaging results that were available during my care of the patient were reviewed by me and considered in my medical decision making (see chart for details).     She has anaphylaxis to penicillin, so clindamycin is sent in to treat the infection.  I recommended warm compresses.  She will return if it starts to point and needs draining.  PCR COVID swab is done, and if positive she would be a candidate for Paxlovid with her immunosuppression.  Last EGFR was greater than 90, found in care everywhere Final Clinical Impressions(s) / UC Diagnoses   Final diagnoses:  Cellulitis, face  Acute upper respiratory infection     Discharge Instructions      --Take clindamycin 300 mg-- 1 capsule 3 times daily for 7 days   You have been swabbed for COVID, and the test will result in the next 24 hours. Our staff will call you if positive. If the COVID test is positive, you should quarantine until you are fever free for 24 hours and you are starting to feel better, and then take added precautions for the next 5 days, such as physical distancing/wearing a mask and good hand hygiene/washing.       ED Prescriptions     Medication Sig Dispense Auth. Provider   clindamycin (CLEOCIN) 300 MG capsule Take 1 capsule (300 mg total) by mouth 3 (three) times daily for 7 days. 21 capsule Zenia Resides, MD   traMADol (ULTRAM) 50 MG tablet Take 1 tablet (50 mg total) by mouth every 6 (six) hours as needed (pain). 12 tablet Shalie Schremp, Janace Aris, MD      I have reviewed the PDMP during this encounter.    Zenia Resides, MD 04/08/23 [phone]

## 2023-04-08 NOTE — ED Triage Notes (Signed)
"  I have a bump on my right side and it is swelling up my face." - Entered by patient  States it started as a pimple that she thinks has gotten infected. Patient did try to pop it. Now having pain a long the right side of the face and into the right ear. Onset 2 weeks.

## 2023-04-08 NOTE — ED Notes (Signed)
 Patient declined COVID swab when going to complete the order. Testing not completed.

## 2023-04-12 ENCOUNTER — Encounter (HOSPITAL_COMMUNITY): Payer: Self-pay

## 2023-04-12 ENCOUNTER — Ambulatory Visit (HOSPITAL_COMMUNITY)
Admission: RE | Admit: 2023-04-12 | Discharge: 2023-04-12 | Disposition: A | Discharge status: Home / Self Care | Payer: MEDICAID | Source: Ambulatory Visit | Attending: Family Medicine | Admitting: Family Medicine

## 2023-04-12 VITALS — BP 102/64 | HR 80 | Temp 98.7°F | Resp 18

## 2023-04-12 DIAGNOSIS — L03211 Cellulitis of face: Secondary | ICD-10-CM | POA: Diagnosis not present

## 2023-04-12 DIAGNOSIS — L0201 Cutaneous abscess of face: Secondary | ICD-10-CM

## 2023-04-12 NOTE — Discharge Instructions (Signed)
 Continue antibiotics.  Abscess is actively draining and likely will not require any further interventions when infection resolves.  Continue to apply warm compresses.  Once medication is completed abscess remains return for reevaluation.

## 2023-04-12 NOTE — ED Triage Notes (Signed)
 Pt states she was seen on 2/28 for pimple on the right side of her face she is still taking the antibiotics. She states that the pimple started to drain last night. She is taking tramadol for pain .

## 2023-04-12 NOTE — ED Provider Notes (Signed)
 [facility]    CSN: [id number] Arrival date & time: 04/12/23  1618      History   Chief Complaint Chief Complaint  Patient presents with  . Facial Pain    The bump that I came in for has popped and I need to be seen. - Entered by patient  . Abscess    HPI Tammy Hale is a 38 y.o. female.    Abscess Patient with a history of eczema and lupus presents today for reevaluation of facial cellulitis infection with abscess.  Patient seen here at urgent care on 04/08/2023.  Patient was treated with clindamycin and reports she has approximately 4 days left of medication.  Patient comes in for evaluation as the site is now draining and she wanted have abscess looked at. Past Medical History:  Diagnosis Date  . Eczema   . Lupus     Patient Active Problem List   Diagnosis Date Noted  . Intractable chronic migraine without aura and without status migrainosus 11/01/2022  . Recurrent vaginitis 11/01/2022  . Nightmares 05/24/2022  . Lupus 04/01/2022  . Psychophysiological insomnia 04/01/2022  . Moderate episode of recurrent major depressive disorder (HCC) 07/16/2021  . Anxiety 07/16/2021  . 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 Moderate cervical atypia 08/24/2017  . Chronic pain of both lower extremities 01/13/2017    Past Surgical History:  Procedure Laterality Date  . HERNIA REPAIR      OB History   No obstetric history on file.      Home Medications    Prior to Admission medications   Medication Sig Start Date End Date Taking? Authorizing Provider  acetaminophen (TYLENOL) 500 MG tablet Take 1 tablet (500 mg total) by mouth every 6 (six) hours as needed. 03/17/23  Yes Derwood Kaplan, MD  clindamycin (CLEOCIN) 300 MG capsule Take 1 capsule (300 mg total) by mouth 3 (three) times daily for 7 days. 04/08/23 04/15/23 Yes Zenia Resides, MD  hydrOXYzine (ATARAX) 25 MG tablet Take 1 tablet (25 mg total) by mouth 3 (three) times daily as needed. 03/30/23  Yes  Toy Cookey E, NP  lidocaine 4 % Place 1 patch onto the skin 2 (two) times daily. 03/17/23  Yes Derwood Kaplan, MD  methocarbamol (ROBAXIN) 500 MG tablet Take 1 tablet (500 mg total) by mouth 2 (two) times daily. 03/17/23  Yes Derwood Kaplan, MD  nicotine polacrilex (NICORETTE) 4 MG gum Take 1 each (4 mg total) by mouth as needed for smoking cessation. 03/30/23  Yes Toy Cookey E, NP  risperiDONE (RISPERDAL) 3 MG tablet Take 1 tablet (3 mg total) by mouth at bedtime. 03/30/23  Yes Toy Cookey E, NP  SUMAtriptan (IMITREX) 50 MG tablet Take 1 tablet (50 mg total) by mouth every 2 (two) hours as needed for migraine. May repeat in 2 hours if headache persists or recurs. 11/01/22  Yes Saralyn Pilar A, PA  traMADol (ULTRAM) 50 MG tablet Take 1 tablet (50 mg total) by mouth every 6 (six) hours as needed (pain). 04/08/23  Yes Zenia Resides, MD  triamcinolone cream (KENALOG) 0.1 % Apply 1 Application topically 2 (two) times daily. 09/15/22  Yes Garrison, [place] N, FNP    Family History Family History  Problem Relation Age of Onset  . Healthy Mother     Social History Social History   Tobacco Use  . Smoking status: Every Day    Types: Cigarettes    Start date: 2014    Passive exposure: Never  .  Smokeless tobacco: Never  Vaping Use  . Vaping status: Never Used  Substance Use Topics  . Alcohol use: No  . Drug use: Not Currently    Types: Marijuana    Comment: former user     Allergies   Penicillins   Review of Systems Review of Systems Pertinent negatives listed in HPI   Physical Exam Triage Vital Signs ED Triage Vitals  Encounter Vitals Group     BP 04/12/23 1726 102/64     Systolic BP Percentile --      Diastolic BP Percentile --      Pulse Rate 04/12/23 1726 80     Resp 04/12/23 1726 18     Temp 04/12/23 1725 98.7 F (37.1 C)     Temp Source 04/12/23 1725 Oral     SpO2 04/12/23 1726 97 %     Weight --      Height --      Head Circumference --       Peak Flow --      Pain Score 04/12/23 1724 9     Pain Loc --      Pain Education --      Exclude from Growth Chart --    No data found.  Updated Vital Signs BP 102/64 (BP Location: Left Arm)   Pulse 80   Temp 98.7 F (37.1 C) (Oral)   Resp 18   LMP 03/13/2023 (Approximate)   SpO2 97%   Visual Acuity Right Eye Distance:   Left Eye Distance:   Bilateral Distance:    Right Eye Near:   Left Eye Near:    Bilateral Near:     Physical Exam Vitals reviewed.  Constitutional:      Appearance: Normal appearance.  HENT:     Head: Normocephalic and atraumatic.   Eyes:     Extraocular Movements: Extraocular movements intact.     Pupils: Pupils are equal, round, and reactive to light.  Cardiovascular:     Rate and Rhythm: Normal rate and regular rhythm.  Pulmonary:     Effort: Pulmonary effort is normal.     Breath sounds: Normal breath sounds.  Neurological:     General: No focal deficit present.     Mental Status: She is alert.     UC Treatments / Results  Labs (all labs ordered are listed, but only abnormal results are displayed) Labs Reviewed - No data to display  EKG   Radiology No results found.  Procedures Procedures (including critical care time)  Medications Ordered in UC Medications - No data to display  Initial Impression / Assessment and Plan / UC Course  I have reviewed the triage vital signs and the nursing notes.  Pertinent labs & imaging results that were available during my care of the patient were reviewed by me and considered in my medical decision making (see chart for details).     Final Clinical Impressions(s) / UC Diagnoses   Final diagnoses:  Facial cellulitis  Facial abscess     Discharge Instructions      Continue antibiotics.  Abscess is actively draining and likely will not require any further interventions when infection resolves.  Continue to apply warm compresses.  Once medication is completed abscess remains return for  reevaluation.     ED Prescriptions   None    PDMP not reviewed this encounter.

## 2023-04-13 ENCOUNTER — Other Ambulatory Visit: Payer: Self-pay | Admitting: Family Medicine

## 2023-04-13 DIAGNOSIS — G43719 Chronic migraine without aura, intractable, without status migrainosus: Secondary | ICD-10-CM

## 2023-04-14 ENCOUNTER — Other Ambulatory Visit: Payer: Self-pay

## 2023-04-14 ENCOUNTER — Ambulatory Visit: Payer: MEDICAID | Admitting: Family Medicine

## 2023-04-14 ENCOUNTER — Other Ambulatory Visit (HOSPITAL_COMMUNITY): Payer: Self-pay

## 2023-04-14 MED ORDER — SUMATRIPTAN SUCCINATE 50 MG PO TABS
50.0000 mg | ORAL_TABLET | ORAL | 0 refills | Status: AC | PRN
  Filled 2023-04-14: qty 30, 3d supply, fill #0
  Filled 2023-05-02: qty 12, 30d supply, fill #0

## 2023-04-14 NOTE — Progress Notes (Deleted)
   Acute Office Visit  Subjective:     Patient ID: Tammy Hale, female    DOB: [birth year], 38 y.o.   MRN: [id number]  No chief complaint on file.   HPI Patient is in today for ***  ROS      Objective:    LMP 03/13/2023 (Approximate)   Physical Exam  No results found for any visits on 04/14/23.      Assessment & Plan:  There are no diagnoses linked to this encounter.  No follow-ups on file.  Melida Quitter, PA

## 2023-04-19 ENCOUNTER — Other Ambulatory Visit: Payer: Self-pay

## 2023-04-20 ENCOUNTER — Ambulatory Visit: Payer: Medicaid Other | Admitting: Family Medicine

## 2023-05-03 ENCOUNTER — Encounter: Payer: Self-pay | Admitting: Pharmacist

## 2023-05-03 ENCOUNTER — Other Ambulatory Visit: Payer: Self-pay

## 2023-05-03 ENCOUNTER — Other Ambulatory Visit (HOSPITAL_COMMUNITY): Payer: Self-pay

## 2023-05-05 ENCOUNTER — Encounter (HOSPITAL_COMMUNITY): Payer: Self-pay

## 2023-05-05 ENCOUNTER — Other Ambulatory Visit: Payer: Self-pay | Admitting: Family Medicine

## 2023-05-05 ENCOUNTER — Ambulatory Visit (HOSPITAL_COMMUNITY): Payer: MEDICAID | Admitting: Clinical

## 2023-05-06 ENCOUNTER — Other Ambulatory Visit: Payer: Self-pay

## 2023-05-27 ENCOUNTER — Encounter (HOSPITAL_COMMUNITY): Payer: Self-pay

## 2023-06-02 ENCOUNTER — Telehealth (HOSPITAL_COMMUNITY): Payer: MEDICAID | Admitting: Psychiatry

## 2023-06-03 ENCOUNTER — Telehealth (HOSPITAL_COMMUNITY): Payer: MEDICAID | Admitting: Psychiatry

## 2023-06-03 ENCOUNTER — Encounter (HOSPITAL_COMMUNITY): Payer: Self-pay

## 2023-06-07 ENCOUNTER — Ambulatory Visit (HOSPITAL_COMMUNITY): Payer: MEDICAID

## 2023-06-08 ENCOUNTER — Ambulatory Visit (HOSPITAL_COMMUNITY): Payer: MEDICAID

## 2023-06-28 ENCOUNTER — Ambulatory Visit (HOSPITAL_COMMUNITY)
Admission: RE | Admit: 2023-06-28 | Discharge: 2023-06-28 | Disposition: A | Discharge status: Home / Self Care | Payer: MEDICAID | Source: Ambulatory Visit | Attending: Internal Medicine | Admitting: Internal Medicine

## 2023-06-28 ENCOUNTER — Encounter (HOSPITAL_COMMUNITY): Payer: Self-pay

## 2023-06-28 VITALS — BP 110/74 | HR 98 | Temp 99.1°F | Resp 18

## 2023-06-28 DIAGNOSIS — R112 Nausea with vomiting, unspecified: Secondary | ICD-10-CM | POA: Diagnosis not present

## 2023-06-28 DIAGNOSIS — R197 Diarrhea, unspecified: Secondary | ICD-10-CM

## 2023-06-28 DIAGNOSIS — R1084 Generalized abdominal pain: Secondary | ICD-10-CM | POA: Diagnosis not present

## 2023-06-28 MED ORDER — ONDANSETRON 4 MG PO TBDP
ORAL_TABLET | ORAL | Status: AC
  Filled 2023-06-28: qty 1

## 2023-06-28 MED ORDER — ONDANSETRON 4 MG PO TBDP
4.0000 mg | ORAL_TABLET | Freq: Once | ORAL | Status: AC
  Administered 2023-06-28: 4 mg via ORAL

## 2023-06-28 NOTE — Discharge Instructions (Addendum)
 Due to the level of abdominal pain present on physical exam, we recommend further evaluation in the emergency room where more advanced workup can be done.

## 2023-06-28 NOTE — ED Provider Notes (Signed)
 [facility]    CSN: [id number] Arrival date & time: 06/28/23  1207      History   Chief Complaint Chief Complaint  Patient presents with   appt - abominal pain/vomiting    HPI Tammy Hale is a 38 y.o. female.   38 year old female who presents to urgent care with complaints of abdominal pain for 3 days.  She reports that the abdominal pain started around the lower aspect but has become more generalized.  She has had at least 4 episodes of vomiting with the last at 0500.  She is drinking but is not able to taking any solid foods without vomiting.  For the last 2 days she has also had some diarrhea.  This is also associated with a headache but reports that the headache is not unusual for her as she has migraines. The headache is in the normal location for her when she has migraines.  She has been taking her Imitrex  for this but the headache is not getting a lot better.  She denies fevers but is having chills.  She denies dysuria, hematuria, fevers, new medications, vomiting blood, blood per rectum.  She does work at [employer] and reports that there is another person at work that is sick with gastrointestinal symptoms.  She does have a history of an umbilical hernia repair as a baby but has had no other surgeries.  She has no history of having abdominal pain like this in the past.      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      Home Medications    Prior to Admission medications   Medication Sig Start Date End Date Taking? Authorizing Provider  hydrOXYzine  (ATARAX ) 25 MG tablet Take 1 tablet (25 mg total) by mouth 3 (three) times daily as needed. 03/30/23   Parsons, Brittney E, NP  lidocaine  4 % Place 1 patch onto the skin 2 (two) times daily. 03/17/23   Deatra Face, MD  nicotine  polacrilex (NICORETTE ) 4 MG gum Take 1 each (4 mg total) by mouth as needed for smoking cessation. 03/30/23   Arlyne Bering, NP  risperiDONE  (RISPERDAL ) 3 MG tablet Take 1 tablet (3 mg total) by mouth at bedtime. 03/30/23   Arlyne Bering, NP  SUMAtriptan  (IMITREX ) 50 MG tablet Take 1 tablet (50 mg total) by mouth every 2 (two) hours as needed for migraine. May repeat in 2 hours if headache persists or recurs. 04/14/23   Noreene Bearded, PA  traMADol  (ULTRAM ) 50 MG tablet Take 1 tablet (50 mg total) by mouth every 6 (six) hours as needed (pain). 04/08/23   Banister, Pamela K, MD  triamcinolone  cream (KENALOG ) 0.1 % Apply 1 Application topically 2 (two) times daily. 09/15/22   Galen Judge, FNP    Family History Family History  Problem Relation Age  of Onset   Healthy Mother     Social History Social History   Tobacco Use   Smoking status: Every Day    Types: Cigarettes    Start date: 2014    Passive exposure: Never   Smokeless tobacco: Never  Vaping Use   Vaping status: Never Used  Substance Use Topics   Alcohol use: No   Drug use: Not Currently    Types: Marijuana    Comment: former user     Allergies   Penicillins   Review of Systems Review of Systems  Constitutional:  Positive for appetite change, chills and fatigue. Negative for fever.  HENT:  Negative for ear pain and sore throat.   Eyes:  Negative for pain and visual disturbance.  Respiratory:  Negative for cough and shortness of breath.   Cardiovascular:   Negative for chest pain and palpitations.  Gastrointestinal:  Positive for abdominal pain, diarrhea, nausea and vomiting. Negative for blood in stool.  Genitourinary:  Negative for dysuria and hematuria.  Musculoskeletal:  Negative for arthralgias and back pain.  Skin:  Negative for color change and rash.  Neurological:  Negative for seizures and syncope.  All other systems reviewed and are negative.    Physical Exam Triage Vital Signs ED Triage Vitals [06/28/23 1238]  Encounter Vitals Group     BP 110/74     Systolic BP Percentile      Diastolic BP Percentile      Pulse Rate 98     Resp 18     Temp 99.1 F (37.3 C)     Temp Source Oral     SpO2 96 %     Weight      Height      Head Circumference      Peak Flow      Pain Score 8     Pain Loc      Pain Education      Exclude from Growth Chart    No data found.  Updated Vital Signs BP 110/74 (BP Location: Right Arm)   Pulse 98   Temp 99.1 F (37.3 C) (Oral)   Resp 18   LMP 06/28/2023 (Exact Date)   SpO2 96%   Visual Acuity Right Eye Distance:   Left Eye Distance:   Bilateral Distance:    Right Eye Near:   Left Eye Near:    Bilateral Near:     Physical Exam Vitals and nursing note reviewed.  Constitutional:      General: She is not in acute distress.    Appearance: Normal appearance. She is well-developed.  HENT:     Head: Normocephalic and atraumatic.  Eyes:     Conjunctiva/sclera: Conjunctivae normal.  Cardiovascular:     Rate and Rhythm: Normal rate and regular rhythm.     Heart sounds: No murmur heard. Pulmonary:     Effort: Pulmonary effort is normal. No respiratory distress.     Breath sounds: Normal breath sounds.  Abdominal:     Palpations: Abdomen is soft.     Tenderness: There is generalized abdominal tenderness. There is no guarding or rebound.     Hernia: There is no hernia in the umbilical area.     Comments: Large scar at the umbilicus.  Pt barely able to tolerate palpitation of the  abdomen.  She begins to cry with deep palpitation and appears to be in severe pain.  She is not obviously guarding and does not have any obvious rebound but  does seem to have pretty severe abdominal pain with palpitation that is generalized.  Do not believe there is an umbilical hernia But due to the level of pain this was difficult to assess.  Musculoskeletal:        General: No swelling.     Cervical back: Neck supple.  Skin:    General: Skin is warm and dry.     Capillary Refill: Capillary refill takes less than 2 seconds.  Neurological:     Mental Status: She is alert.  Psychiatric:        Mood and Affect: Mood normal.      UC Treatments / Results  Labs (all labs ordered are listed, but only abnormal results are displayed) Labs Reviewed - No data to display  EKG   Radiology No results found.  Procedures Procedures (including critical care time)  Medications Ordered in UC Medications  ondansetron  (ZOFRAN -ODT) disintegrating tablet 4 mg (4 mg Oral Given 06/28/23 1254)    Initial Impression / Assessment and Plan / UC Course  I have reviewed the triage vital signs and the nursing notes.  Pertinent labs & imaging results that were available during my care of the patient were reviewed by me and considered in my medical decision making (see chart for details).     Generalized abdominal pain  Nausea and vomiting in adult  Diarrhea of presumed infectious origin   Patient presented with 3 days of abdominal pain, nausea, vomiting and 2 days of diarrhea.  Although she reassuringly has been able to tolerate liquids and has been having bowel movements, her physical exam demonstrated an extremely tender abdomen that is generalized.  We did give her Zofran  here secondary to her nausea but her abdominal pain is very concerning as it does seem to be out of proportion for a viral illness or foodborne illness.  Discussed with the patient that we feel she may need advanced imaging and  recommend further evaluation at the emergency room.  The patient understands and is in agreement with this.  Final Clinical Impressions(s) / UC Diagnoses   Final diagnoses:  Generalized abdominal pain  Nausea and vomiting in adult  Diarrhea of presumed infectious origin     Discharge Instructions      Due to the level of abdominal pain present on physical exam, we recommend further evaluation in the emergency room where more advanced workup can be done.   ED Prescriptions   None    PDMP not reviewed this encounter.   Kreg Pesa, PA-C 06/28/23 1304

## 2023-06-28 NOTE — ED Triage Notes (Addendum)
 Pt c/o constant abdominal pain x2 days and vomiting since yesterday. States vomit x1 today. Pt c/o diarrheas since yesterday also.

## 2023-06-28 NOTE — ED Notes (Signed)
 Patient is being discharged from the Urgent Care and sent to the Emergency Department via POV . Per Liz,PA, patient is in need of higher level of care due to abdominal tenderness/pain. Patient is aware and verbalizes understanding of plan of care.  Vitals:   06/28/23 1238  BP: 110/74  Pulse: 98  Resp: 18  Temp: 99.1 F (37.3 C)  SpO2: 96%

## 2023-07-26 ENCOUNTER — Encounter (HOSPITAL_COMMUNITY): Payer: Self-pay

## 2023-07-26 ENCOUNTER — Ambulatory Visit (HOSPITAL_COMMUNITY)
Admission: EM | Admit: 2023-07-26 | Discharge: 2023-07-26 | Disposition: A | Discharge status: Home / Self Care | Payer: MEDICAID | Source: Home / Self Care | Attending: Family Medicine | Admitting: Family Medicine

## 2023-07-26 ENCOUNTER — Inpatient Hospital Stay (HOSPITAL_COMMUNITY): Admission: RE | Admit: 2023-07-26 | Payer: MEDICAID | Source: Ambulatory Visit

## 2023-07-26 DIAGNOSIS — R0602 Shortness of breath: Secondary | ICD-10-CM

## 2023-07-26 DIAGNOSIS — M25561 Pain in right knee: Secondary | ICD-10-CM | POA: Diagnosis not present

## 2023-07-26 DIAGNOSIS — M329 Systemic lupus erythematosus, unspecified: Secondary | ICD-10-CM | POA: Diagnosis not present

## 2023-07-26 DIAGNOSIS — H6123 Impacted cerumen, bilateral: Secondary | ICD-10-CM | POA: Diagnosis not present

## 2023-07-26 DIAGNOSIS — G8929 Other chronic pain: Secondary | ICD-10-CM

## 2023-07-26 MED ORDER — PREDNISONE 20 MG PO TABS
40.0000 mg | ORAL_TABLET | Freq: Every day | ORAL | 0 refills | Status: AC
Start: 2023-07-26 — End: 2023-07-31

## 2023-07-26 MED ORDER — MELOXICAM 15 MG PO TABS: 15.0000 mg | ORAL_TABLET | Freq: Every day | ORAL | 0 refills | Status: AC
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
## 2023-07-26 NOTE — Discharge Instructions (Signed)
 Take prednisone  20 mg--2 daily for 5 days  Meloxicam  15 mg--1 daily as needed for pain.

## 2023-08-10 ENCOUNTER — Encounter: Payer: Self-pay | Admitting: Family Medicine

## 2023-08-11 ENCOUNTER — Ambulatory Visit: Payer: MEDICAID | Admitting: Physician Assistant

## 2023-08-18 ENCOUNTER — Ambulatory Visit: Payer: MEDICAID | Admitting: Physician Assistant

## 2023-08-23 ENCOUNTER — Encounter: Payer: Self-pay | Admitting: Family

## 2023-08-23 ENCOUNTER — Ambulatory Visit (INDEPENDENT_AMBULATORY_CARE_PROVIDER_SITE_OTHER): Payer: MEDICAID | Admitting: Family

## 2023-08-23 ENCOUNTER — Other Ambulatory Visit (HOSPITAL_COMMUNITY)
Admission: RE | Admit: 2023-08-23 | Discharge: 2023-08-23 | Disposition: A | Discharge status: Home / Self Care | Payer: MEDICAID | Source: Ambulatory Visit | Attending: Family | Admitting: Family

## 2023-08-23 VITALS — BP 98/62 | HR 75 | Temp 98.5°F | Resp 16 | Ht 66.0 in | Wt 152.6 lb

## 2023-08-23 DIAGNOSIS — B009 Herpesviral infection, unspecified: Secondary | ICD-10-CM | POA: Diagnosis not present

## 2023-08-23 DIAGNOSIS — M329 Systemic lupus erythematosus, unspecified: Secondary | ICD-10-CM

## 2023-08-23 DIAGNOSIS — Z7689 Persons encountering health services in other specified circumstances: Secondary | ICD-10-CM

## 2023-08-23 DIAGNOSIS — F418 Other specified anxiety disorders: Secondary | ICD-10-CM | POA: Diagnosis not present

## 2023-08-23 DIAGNOSIS — Z113 Encounter for screening for infections with a predominantly sexual mode of transmission: Secondary | ICD-10-CM | POA: Insufficient documentation

## 2023-08-23 DIAGNOSIS — Z114 Encounter for screening for human immunodeficiency virus [HIV]: Secondary | ICD-10-CM

## 2023-08-23 DIAGNOSIS — F419 Anxiety disorder, unspecified: Secondary | ICD-10-CM

## 2023-08-23 MED ORDER — VALACYCLOVIR HCL 1 G PO TABS: 1000.0000 mg | ORAL_TABLET | Freq: Two times a day (BID) | ORAL | 2 refills | Status: AC

## 2023-08-23 MED ORDER — ESCITALOPRAM OXALATE 5 MG PO TABS: 5.0000 mg | ORAL_TABLET | Freq: Every day | ORAL | 0 refills | Status: AC

## 2023-08-23 NOTE — Progress Notes (Signed)
 Subjective:    Tammy Hale - 38 y.o. female MRN [id number]  Date of birth: [birth year]  HPI  Tammy Hale is to establish care.   Current issues and/or concerns: - Anxiety depression. States she recently lost custody of 2 of her children. States she has 2 children remaining at home. States anxiety depression affecting sleep and appetite. States she has a bad temper and short fuse. 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 States she is working at her job to stay busy. States in the past she tried anxiety depression medication and therapy and did not feel that it helped. She would like to try medication again to see if this helps. She denies thoughts of self-harm, suicidal ideations, homicidal ideations. - History of lupus. States she was established with a specialist before moving to [place] .  - States vaginal herpes flare.  - Routine STD screening. Denies symptoms and recent exposure.  - Patient recently seen at Urgent Care. States she has an upcoming appointment at Orthopedics for knee pain and remaining diagnoses resolved. - Patient recently seen at Urgent Care. States she is no longer having abdominal pain.  - No further issues/concerns for discussion today.   ROS per HPI     Health Maintenance:   Health Maintenance Due  Topic Date Due   Cervical Cancer Screening (HPV/Pap Cotest)  01/14/2022     Past Medical History: Patient Active Problem List   Diagnosis Date Noted   Intractable chronic migraine without aura and without status migrainosus 11/01/2022   Recurrent vaginitis 11/01/2022   Nightmares 05/24/2022   Lupus 04/01/2022   Psychophysiological insomnia 04/01/2022   Moderate episode of recurrent major depressive disorder (HCC) 07/16/2021   Anxiety 07/16/2021   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   Moderate cervical atypia 08/24/2017   Chronic pain of both lower extremities 01/13/2017      Social History   reports that she has been smoking cigarettes.  She started smoking about 11 years ago. She has never been exposed to tobacco smoke. She has never used smokeless tobacco. She reports that she does not currently use drugs after having used the following drugs: Marijuana. She reports that she does not drink alcohol.   Family History  family history includes Healthy in her mother.   Medications: reviewed and updated   Objective:   Physical Exam BP 98/62   Pulse 75   Temp 98.5 F (36.9 C) (Oral)   Resp 16   Ht 5' 6 (1.676 m)   Wt 152 lb 9.6 oz (69.2 kg)   LMP 08/09/2023   SpO2 97%   BMI 24.63 kg/m   Physical Exam HENT:     Head: Normocephalic and atraumatic.     Nose: Nose normal.     Mouth/Throat:     Mouth: Mucous membranes are moist.     Pharynx: Oropharynx is clear.  Eyes:     Extraocular Movements: Extraocular movements intact.     Conjunctiva/sclera: Conjunctivae normal.     Pupils: Pupils are equal, round, and reactive to light.  Cardiovascular:     Rate and Rhythm: Normal rate and regular rhythm.     Pulses: Normal pulses.     Heart sounds: Normal heart sounds.  Pulmonary:     Effort: Pulmonary effort is normal.     Breath sounds: Normal breath sounds.  Abdominal:     General: Bowel sounds are normal.     Palpations: Abdomen is soft.  Musculoskeletal:        General: Normal  range of motion.     Cervical back: Normal range of motion and neck supple.  Neurological:     General: No focal deficit present.     Mental Status: She is alert and oriented to person, place, and time.  Psychiatric:        Mood and Affect: Affect is tearful.      Assessment & Plan:  1. Encounter to establish care (Primary) - Patient presents today to establish care. During the interim follow-up with primary provider as scheduled.  - Return for annual physical examination, labs, and health maintenance. Arrive fasting meaning having no food for at least 8 hours prior to appointment. You may have only water or black coffee. Please take  scheduled medications as normal.  2. Anxiety and depression - Patient denies thoughts of self-harm, suicidal ideations, homicidal ideations. - Trial Escitalopram  as prescribed. Counseled on medication adherence/adverse effects.  - Patient declined referral to Psychiatry.  - Follow-up with primary provider in 4 weeks or sooner if needed.  - escitalopram  (LEXAPRO ) 5 MG tablet; Take 1 tablet (5 mg total) by mouth daily.  Dispense: 90 tablet; Refill: 0  3. Systemic lupus erythematosus, unspecified SLE type, unspecified organ involvement status (HCC) - Referral to Rheumatology for evaluation/management. - Ambulatory referral to Rheumatology  4. Herpes simplex virus (HSV) infection - Valacyclovir  as prescribed. Counseled on medication adherence/adverse effects.  - Follow-up with primary provider as scheduled. - valACYclovir  (VALTREX ) 1000 MG tablet; Take 1 tablet (1,000 mg total) by mouth 2 (two) times daily.  Dispense: 20 tablet; Refill: 2  5. Routine screening for STI (sexually transmitted infection) - Routine screening.  - Cervicovaginal ancillary only  6. Encounter for screening for HIV - Routine screening.  - HIV antibody (with reflex)   Patient was given clear instructions to go to Emergency Department or return to medical center if symptoms don't improve, worsen, or new problems develop.The patient verbalized understanding.  I discussed the assessment and treatment plan with the patient. The patient was provided an opportunity to ask questions and all were answered. The patient agreed with the plan and demonstrated an understanding of the instructions.   The patient was advised to call back or seek an in-person evaluation if the symptoms worsen or if the condition fails to improve as anticipated.    Greig Drones, NP 08/23/2023, 10:30 AM Primary Care at [facility]

## 2023-08-23 NOTE — Progress Notes (Signed)
 Patient has lupus and is having an outbreak and patient has herpes and is having a outbreak,  STD check, patient scored a 11 on the PHQ-9, patient scored a 14 on GAD-7

## 2023-08-24 ENCOUNTER — Ambulatory Visit: Payer: Self-pay | Admitting: Family

## 2023-08-24 LAB — CERVICOVAGINAL ANCILLARY ONLY
Bacterial Vaginitis (gardnerella): NEGATIVE
Candida Glabrata: NEGATIVE
Candida Vaginitis: NEGATIVE
Chlamydia: NEGATIVE
Comment: NEGATIVE
Comment: NEGATIVE
Comment: NEGATIVE
Comment: NEGATIVE
Comment: NEGATIVE
Comment: NORMAL
Neisseria Gonorrhea: NEGATIVE
Trichomonas: NEGATIVE

## 2023-08-24 LAB — HIV ANTIBODY (ROUTINE TESTING W REFLEX): HIV Screen 4th Generation wRfx: NONREACTIVE

## 2023-08-25 ENCOUNTER — Ambulatory Visit: Payer: MEDICAID | Admitting: Physician Assistant

## 2023-09-01 ENCOUNTER — Encounter: Payer: MEDICAID | Admitting: Family

## 2023-09-01 NOTE — Progress Notes (Signed)
 Erroneous encounter-disregard

## 2023-09-06 ENCOUNTER — Ambulatory Visit: Payer: MEDICAID | Admitting: Physician Assistant

## 2023-09-06 DIAGNOSIS — G8929 Other chronic pain: Secondary | ICD-10-CM | POA: Diagnosis not present

## 2023-09-06 DIAGNOSIS — M25561 Pain in right knee: Secondary | ICD-10-CM
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
## 2023-12-12 ENCOUNTER — Encounter: Payer: Self-pay | Admitting: Radiology

## 2024-03-02 ENCOUNTER — Encounter: Payer: MEDICAID | Admitting: Family

## 2024-03-02 NOTE — Progress Notes (Signed)
 Erroneous encounter-disregard

## 2024-03-15 ENCOUNTER — Ambulatory Visit: Payer: MEDICAID | Admitting: Family

## 2024-04-06 ENCOUNTER — Ambulatory Visit: Payer: MEDICAID | Admitting: Family
